# Patient Record
Sex: Female | Born: 1960 | Race: White | Hispanic: No | Smoking: Current every day smoker
Health system: Southern US, Community
[De-identification: ages and names within clinical notes are randomized; demographics above are authoritative.]

## PROBLEM LIST (undated history)

## (undated) DIAGNOSIS — E079 Disorder of thyroid, unspecified: Secondary | ICD-10-CM

## (undated) DIAGNOSIS — F329 Major depressive disorder, single episode, unspecified: Secondary | ICD-10-CM

## (undated) DIAGNOSIS — F909 Attention-deficit hyperactivity disorder, unspecified type: Secondary | ICD-10-CM

## (undated) DIAGNOSIS — N289 Disorder of kidney and ureter, unspecified: Secondary | ICD-10-CM

## (undated) DIAGNOSIS — F32A Depression, unspecified: Secondary | ICD-10-CM

## (undated) HISTORY — PX: GASTROPLASTY: SHX192

## (undated) HISTORY — PX: TUBAL LIGATION: SHX77

## (undated) HISTORY — PX: CARPAL TUNNEL RELEASE: SHX101

## (undated) HISTORY — PX: KNEE SURGERY: SHX244

---

## 2013-01-13 ENCOUNTER — Emergency Department: Payer: Self-pay | Admitting: Emergency Medicine

## 2013-10-08 ENCOUNTER — Encounter (HOSPITAL_COMMUNITY): Payer: Self-pay | Admitting: Emergency Medicine

## 2013-10-08 ENCOUNTER — Emergency Department (HOSPITAL_COMMUNITY)
Admission: EM | Admit: 2013-10-08 | Discharge: 2013-10-08 | Disposition: A | Discharge status: Home / Self Care | Payer: Self-pay | Attending: Emergency Medicine | Admitting: Emergency Medicine

## 2013-10-08 DIAGNOSIS — F17200 Nicotine dependence, unspecified, uncomplicated: Secondary | ICD-10-CM | POA: Insufficient documentation

## 2013-10-08 DIAGNOSIS — Z760 Encounter for issue of repeat prescription: Secondary | ICD-10-CM | POA: Insufficient documentation

## 2013-10-08 DIAGNOSIS — Z8639 Personal history of other endocrine, nutritional and metabolic disease: Secondary | ICD-10-CM | POA: Insufficient documentation

## 2013-10-08 DIAGNOSIS — Z862 Personal history of diseases of the blood and blood-forming organs and certain disorders involving the immune mechanism: Secondary | ICD-10-CM | POA: Insufficient documentation

## 2013-10-08 HISTORY — DX: Disorder of thyroid, unspecified: E07.9

## 2013-10-08 NOTE — ED Notes (Signed)
Pt reports to ED due to being "out of synthroid" for two days and needs a medication refill. Pt has a 4/10 HA but has  no other complaints

## 2013-10-08 NOTE — Discharge Instructions (Signed)
Medication Refill, Emergency Department Keep your scheduled appointment at the clinic on August 11. Ask them to recheck her blood pressure. Face was mildly elevated at 141/86 We have refilled your medication today as a courtesy to you. It is best for your medical care, however, to take care of getting refills done through your primary caregiver's office. They have your records and can do a better job of follow-up than we can in the emergency department. On maintenance medications, we often only prescribe enough medications to get you by until you are able to see your regular caregiver. This is a more expensive way to refill medications. In the future, please plan for refills so that you will not have to use the emergency department for this. Thank you for your help. Your help allows us to better take care of the daily emergencies that enter our department. Document Released: 06/14/2003 Document Revised: 05/20/2011 Document Reviewed: 06/04/2013 [facility] Patient Information 2015 [place], [place]. This information is not intended to replace advice given to you by your health care provider. Make sure you discuss any questions you have with your health care provider.

## 2013-10-08 NOTE — ED Provider Notes (Signed)
CSN: [id number]     Arrival date & time 10/08/13  1000 History   This chart was scribed for Doug SouSam Cristen Murcia, MD by Milly JakobJohn Lee Graves, ED Scribe. The patient was seen in room APA18/APA18. Patient's care was started at 10:24 AM.    Chief Complaint  Patient presents with  . Medication Refill   The history is provided by the patient. No language interpreter was used.   HPI Comments: Elizabeth Williams is a 53 y.o. female with a history of thyroid disease who presents to the Emergency Department because she needs a prescription refill for synthroid. She reports that she feels rundown and "dropsy" and that this is similar to her thyroid symptoms in the past. She denies having any problems taking synthroid in the past, and states that her dosage has not changed for 2 years. She reports that she is new in town and that she lost her job in June. She states that she is scheduled for an appointment at the free clinic on August 11th. She denies taking any other medications or allergy to any medications. She denies any other health problems. She drinks 1 alcoholic beverage per week. She is not a smoker and she does not use illegal drugs.   Past Medical History  Diagnosis Date  . Thyroid disease    History reviewed. No pertinent past surgical history. No family history on file. History  Substance Use Topics  . Smoking status: Current Every Day Smoker -- 1.00 packs/day    Types: Cigarettes  . Smokeless tobacco: Not on file  . Alcohol Use: 0.6 oz/week    1 Glasses of wine per week   OB History   Grav Para Term Preterm Abortions TAB SAB Ect Mult Living                 Review of Systems  Constitutional: Negative.   HENT: Negative.   Respiratory: Negative.   Cardiovascular: Negative.   Gastrointestinal: Negative.   Musculoskeletal: Negative.   Skin: Negative.   Neurological: Negative.   Psychiatric/Behavioral: Negative.   All other systems reviewed and are negative.  Allergies  Codeine  Home  Medications   Prior to Admission medications   Not on File   Triage Vitals: BP 153/100  Pulse 73  Temp(Src) 97.8 F (36.6 C) (Oral)  Resp 16  Ht 5\' 9"  (1.753 m)  Wt 180 lb (81.647 kg)  BMI 26.57 kg/m2  SpO2 99%  LMP 08/08/2013 Physical Exam  Nursing note and vitals reviewed. Constitutional: She appears well-developed and well-nourished.  HENT:  Head: Normocephalic and atraumatic.  Eyes: Conjunctivae are normal. Pupils are equal, round, and reactive to light.  Neck: Neck supple. No tracheal deviation present. No thyromegaly present.  Cardiovascular: Normal rate and regular rhythm.   No murmur heard. Pulmonary/Chest: Effort normal and breath sounds normal.  Abdominal: Soft. Bowel sounds are normal. She exhibits no distension. There is no tenderness.  Musculoskeletal: Normal range of motion. She exhibits no edema and no tenderness.  Neurological: She is alert. Coordination normal.  Skin: Skin is warm and dry. No rash noted.  Psychiatric: She has a normal mood and affect.    ED Course  Procedures (including critical care time) DIAGNOSTIC STUDIES: Oxygen Saturation is 99% on room air, normal by my interpretation.    COORDINATION OF CARE: 10:28 AM-Discussed treatment plan which includes prescription refill with pt at bedside and pt agreed to plan.   Labs Review Labs Reviewed - No data to display  Imaging Review No  results found.   EKG Interpretation None      MDM  Final diagnoses:  None   Plan prescription Synthroid, 75 mcg one half tablet daily 15 tablets no refills. Blood pressure recheck Patient has scheduled appointment with clinic for August 11 Diagnosis #1 medication refill #2 elevated blood pressure      Doug Sou, MD 10/08/13 1047

## 2013-10-08 NOTE — ED Notes (Signed)
Pt states she is here for refill of synthroid rx. Pt is new to area and currently without insurance.

## 2014-04-13 ENCOUNTER — Emergency Department (HOSPITAL_COMMUNITY)
Admission: EM | Admit: 2014-04-13 | Discharge: 2014-04-13 | Disposition: A | Discharge status: Home / Self Care | Payer: Self-pay | Attending: Emergency Medicine | Admitting: Emergency Medicine

## 2014-04-13 ENCOUNTER — Emergency Department (HOSPITAL_COMMUNITY): Payer: Self-pay

## 2014-04-13 ENCOUNTER — Encounter (HOSPITAL_COMMUNITY): Payer: Self-pay | Admitting: Emergency Medicine

## 2014-04-13 DIAGNOSIS — F329 Major depressive disorder, single episode, unspecified: Secondary | ICD-10-CM | POA: Insufficient documentation

## 2014-04-13 DIAGNOSIS — Y9301 Activity, walking, marching and hiking: Secondary | ICD-10-CM | POA: Insufficient documentation

## 2014-04-13 DIAGNOSIS — S8392XA Sprain of unspecified site of left knee, initial encounter: Secondary | ICD-10-CM | POA: Insufficient documentation

## 2014-04-13 DIAGNOSIS — Y998 Other external cause status: Secondary | ICD-10-CM | POA: Insufficient documentation

## 2014-04-13 DIAGNOSIS — Z79899 Other long term (current) drug therapy: Secondary | ICD-10-CM | POA: Insufficient documentation

## 2014-04-13 DIAGNOSIS — W1849XA Other slipping, tripping and stumbling without falling, initial encounter: Secondary | ICD-10-CM | POA: Insufficient documentation

## 2014-04-13 DIAGNOSIS — Z720 Tobacco use: Secondary | ICD-10-CM | POA: Insufficient documentation

## 2014-04-13 DIAGNOSIS — Y9289 Other specified places as the place of occurrence of the external cause: Secondary | ICD-10-CM | POA: Insufficient documentation

## 2014-04-13 DIAGNOSIS — S86902A Unspecified injury of unspecified muscle(s) and tendon(s) at lower leg level, left leg, initial encounter: Secondary | ICD-10-CM | POA: Insufficient documentation

## 2014-04-13 DIAGNOSIS — Z8639 Personal history of other endocrine, nutritional and metabolic disease: Secondary | ICD-10-CM | POA: Insufficient documentation

## 2014-04-13 HISTORY — DX: Depression, unspecified: F32.A

## 2014-04-13 HISTORY — DX: Major depressive disorder, single episode, unspecified: F32.9

## 2014-04-13 MED ORDER — HYDROCODONE-ACETAMINOPHEN 5-325 MG PO TABS: 1.0000 | ORAL_TABLET | ORAL | Status: DC | PRN

## 2014-04-13 NOTE — ED Notes (Signed)
Patient states she felt a "tweek" in her left knee last week when walking in the snow. Complaining of left knee pain worsening over the week. States she has a history of knee surgery on bilateral knees.

## 2014-04-13 NOTE — Discharge Instructions (Signed)
Joint Sprain A sprain is a tear or stretch in the ligaments that hold a joint together. Severe sprains may need as long as 3-6 weeks of immobilization and/or exercises to heal completely. Sprained joints should be rested and protected. If not, they can become unstable and prone to re-injury. Proper treatment can reduce your pain, shorten the period of disability, and reduce the risk of repeated injuries. TREATMENT   Rest and elevate the injured joint to reduce pain and swelling.  Apply ice packs to the injury for 20-30 minutes every 2-3 hours for the next 2-3 days.  Keep the injury wrapped in a compression bandage or splint as long as the joint is painful or as instructed by your caregiver.  Do not use the injured joint until it is completely healed to prevent re-injury and chronic instability. Follow the instructions of your caregiver.  Long-term sprain management may require exercises and/or treatment by a physical therapist. Taping or special braces may help stabilize the joint until it is completely better. SEEK MEDICAL CARE IF:   You develop increased pain or swelling of the joint.  You develop increasing redness and warmth of the joint.  You develop a fever.  It becomes stiff.  Your hand or foot gets cold or numb. Document Released: 04/04/2004 Document Revised: 05/20/2011 Document Reviewed: 03/14/2008 [facility]Exit[facility] Patient Information 2015 [place], [place]. This information is not intended to replace advice given to you by your health care provider. Make sure you discuss any questions you have with your health care provider.   You may take the hydrocodone prescribed for pain relief.  This will make you drowsy - do not drive within 4 hours of taking this medication. Use the crutches to help rest the joint and wear the knee immobilizer also for rest and protection of the knee.

## 2014-04-13 NOTE — Care Management Note (Signed)
ED/CM noted patient did not have health insurance and/or PCP listed in the computer.  Patient was given the [place] resource handout with information on the clinics, food pantries, and the handout for new health insurance sign-up. Pt was also given a Rx discount card. Patient expressed appreciation for information received. 

## 2014-04-13 NOTE — ED Notes (Signed)
Pt verbalized understanding of no driving and to use caution within 4 hours of taking pain meds due to meds cause drowsiness 

## 2014-04-15 NOTE — ED Provider Notes (Signed)
CSN: [id number]     Arrival date & time 04/13/14  1121 History   First MD Initiated Contact with Patient 04/13/14 1141     Chief Complaint  Patient presents with  . Knee Pain     (Consider location/radiation/quality/duration/timing/severity/associated sxs/prior Treatment) The history is provided by the patient.   Elizabeth BrazilBeryl Bollen is a 54 y.o. female presenting with left knee pain when she slipped (without falling) while walking on the snow and ice last week.  Since she has had persistent pain mostly of the anterior but also posterior knee space along with a fullness and pressure sensation, intermittent swelling and sometimes sensation of instability.  She has a history prior surgery of this knee for a ligament injury. Pain is worsened with movement and improved with rest.  She has used ice and heat without no significant long term improvement.       Past Medical History  Diagnosis Date  . Thyroid disease   . Depression    Past Surgical History  Procedure Laterality Date  . Gastroplasty    . Knee surgery    . Carpal tunnel release     History reviewed. No pertinent family history. History  Substance Use Topics  . Smoking status: Current Every Day Smoker -- 1.00 packs/day    Types: Cigarettes  . Smokeless tobacco: Not on file  . Alcohol Use: 0.6 oz/week    1 Glasses of wine per week     Comment: "once a week"   OB History    No data available     Review of Systems  Constitutional: Negative for fever.  Musculoskeletal: Positive for joint swelling and arthralgias. Negative for myalgias.  Neurological: Negative for weakness and numbness.      Allergies  Codeine and Nsaids  Home Medications   Prior to Admission medications   Medication Sig Start Date End Date Taking? Authorizing Provider  buPROPion (WELLBUTRIN XL) 300 MG 24 hr tablet Take 300 mg by mouth daily.   Yes Historical Provider, MD  HYDROcodone-acetaminophen (NORCO/VICODIN) 5-325 MG per tablet Take 1 tablet  by mouth every 4 (four) hours as needed. 04/13/14   Burgess AmorJulie Naasir Carreira, PA-C   BP 139/86 mmHg  Pulse 75  Temp(Src) 98.6 F (37 C) (Oral)  Resp 16  Ht 5\' 9"  (1.753 m)  Wt 200 lb (90.719 kg)  BMI 29.52 kg/m2  SpO2 99% Physical Exam  Constitutional: She appears well-developed and well-nourished.  HENT:  Head: Atraumatic.  Neck: Normal range of motion.  Cardiovascular:  Pulses equal bilaterally  Musculoskeletal: She exhibits tenderness.       Left knee: She exhibits swelling. She exhibits no effusion, no deformity, no erythema, normal alignment, no LCL laxity, no bony tenderness, normal meniscus and no MCL laxity. Tenderness found. Patellar tendon tenderness noted.  Neurological: She is alert. She has normal strength. She displays normal reflexes. No sensory deficit.  Skin: Skin is warm and dry.  Psychiatric: She has a normal mood and affect.    ED Course  Procedures (including critical care time) Labs Review Labs Reviewed - No data to display  Imaging Review Dg Knee Complete 4 Views Left  04/13/2014   CLINICAL DATA:  Medial knee pain following injury 1 week ago. Initial encounter.  EXAM: LEFT KNEE - COMPLETE 4+ VIEW  COMPARISON:  01/13/2013 radiographs.  FINDINGS: The bones appear mildly demineralized. There is no evidence of acute fracture, dislocation or joint effusion. Mild tricompartmental degenerative changes are present. The metallic staple anteriorly in the proximal tibia  appears unchanged. There is possible mild soft tissue swelling anterior to the patellar tendon.  IMPRESSION: No acute osseous findings. Mild degenerative changes noted. Possible mild prepatellar soft tissue swelling.   Electronically Signed   By: Roxy Horseman M.D.   On: 04/13/2014 11:54     EKG Interpretation None      MDM   Final diagnoses:  Knee sprain and strain, left, initial encounter    Patients labs and/or radiological studies were viewed and considered during the medical decision making and disposition  process. Pt was prescribed hydrocodone, immobilizer and crutches provided, continued heat.  Advised f/u with ortho for further management if sx persist, referral given.    Burgess Amor, PA-C 04/15/14 1222  Geoffery Lyons, MD 04/16/14 [phone]

## 2014-05-22 ENCOUNTER — Emergency Department (HOSPITAL_COMMUNITY)
Admission: EM | Admit: 2014-05-22 | Discharge: 2014-05-22 | Disposition: A | Discharge status: Home / Self Care | Payer: Self-pay | Attending: Emergency Medicine | Admitting: Emergency Medicine

## 2014-05-22 ENCOUNTER — Emergency Department (HOSPITAL_COMMUNITY): Payer: Self-pay

## 2014-05-22 ENCOUNTER — Encounter (HOSPITAL_COMMUNITY): Payer: Self-pay | Admitting: Emergency Medicine

## 2014-05-22 DIAGNOSIS — Z720 Tobacco use: Secondary | ICD-10-CM | POA: Insufficient documentation

## 2014-05-22 DIAGNOSIS — F329 Major depressive disorder, single episode, unspecified: Secondary | ICD-10-CM | POA: Insufficient documentation

## 2014-05-22 DIAGNOSIS — M25562 Pain in left knee: Secondary | ICD-10-CM | POA: Insufficient documentation

## 2014-05-22 DIAGNOSIS — Z9889 Other specified postprocedural states: Secondary | ICD-10-CM | POA: Insufficient documentation

## 2014-05-22 DIAGNOSIS — Z8639 Personal history of other endocrine, nutritional and metabolic disease: Secondary | ICD-10-CM | POA: Insufficient documentation

## 2014-05-22 DIAGNOSIS — Z79899 Other long term (current) drug therapy: Secondary | ICD-10-CM | POA: Insufficient documentation

## 2014-05-22 MED ORDER — ONDANSETRON 8 MG PO TBDP
8.0000 mg | ORAL_TABLET | Freq: Once | ORAL | Status: AC
  Administered 2014-05-22: 8 mg via ORAL
  Filled 2014-05-22: qty 1

## 2014-05-22 MED ORDER — ONDANSETRON HCL 4 MG PO TABS: 4.0000 mg | ORAL_TABLET | Freq: Four times a day (QID) | ORAL | Status: DC

## 2014-05-22 MED ORDER — OXYCODONE-ACETAMINOPHEN 5-325 MG PO TABS: 1.0000 | ORAL_TABLET | ORAL | Status: DC | PRN

## 2014-05-22 MED ORDER — OXYCODONE-ACETAMINOPHEN 5-325 MG PO TABS
1.0000 | ORAL_TABLET | Freq: Once | ORAL | Status: AC
  Administered 2014-05-22: 1 via ORAL
  Filled 2014-05-22: qty 1

## 2014-05-22 NOTE — Discharge Instructions (Signed)

## 2014-05-22 NOTE — ED Notes (Signed)
Pt injured her knee in Jan. After slipping in the snow. Had been better but pt woke up Tues with pain and edema. Pt does not recall specific injury.

## 2014-05-22 NOTE — ED Provider Notes (Signed)
CSN: [id number]     Arrival date & time 05/22/14  1227 History  This chart was scribed for Pauline Aus, PA-C with Donnetta Hutching, MD by Tonye Royalty, ED Scribe. This patient was seen in room APFT24/APFT24 and the patient's care was started at 1:20 PM.    Chief Complaint  Patient presents with  . Knee Pain   The history is provided by the patient. No language interpreter was used.    HPI Comments: Elizabeth Williams is a 54 y.o. female who presents to the Emergency Department complaining of left knee pain. She states she slipped in the snow in January and strained it. She was evaluated here a few days after, records indicate she was seen on 04/13/2014; workup included x-ray which revealed mild degenerative changes and mild pre-patellar soft tissue swelling, but no acute osseous findings. She states pain improved some but never resolved, then pain and swelling worsened 5 days ago. She states pain is worse now than it was in January.  She denies any recent exertion. Redness, excessive warmth, or injury. She states she is unable to put weight on it. She notes her knew cap was "popping" this morning. She states she has used ice and Ibuprofen without remission. She reports surgery to both knees when she was a teenager.  Past Medical History  Diagnosis Date  . Thyroid disease   . Depression    Past Surgical History  Procedure Laterality Date  . Gastroplasty    . Knee surgery    . Carpal tunnel release    . Tubal ligation     Family History  Problem Relation Age of Onset  . Adopted: Yes  . Diabetes Mother   . Diabetes Other    History  Substance Use Topics  . Smoking status: Current Every Day Smoker -- 1.00 packs/day    Types: Cigarettes  . Smokeless tobacco: Never Used  . Alcohol Use: 0.6 oz/week    1 Glasses of wine per week     Comment: "once a week"   OB History    No data available     Review of Systems  Constitutional: Negative for fever and chills.  Genitourinary: Negative for  dysuria and difficulty urinating.  Musculoskeletal: Positive for joint swelling and arthralgias.       Left knee pain  Skin: Negative for color change and wound.  Neurological: Negative for numbness.  All other systems reviewed and are negative.     Allergies  Codeine and Nsaids  Home Medications   Prior to Admission medications   Medication Sig Start Date End Date Taking? Authorizing Provider  buPROPion (WELLBUTRIN XL) 300 MG 24 hr tablet Take 300 mg by mouth daily.    Historical Provider, MD  HYDROcodone-acetaminophen (NORCO/VICODIN) 5-325 MG per tablet Take 1 tablet by mouth every 4 (four) hours as needed. 04/13/14   Burgess Amor, PA-C   BP 149/84 mmHg  Pulse 98  Temp(Src) 98.4 F (36.9 C) (Oral)  Resp 14  Ht  (1.753 m)  Wt 190 lb (86.183 kg)  BMI 28.05 kg/m2  SpO2 97% Physical Exam  Constitutional: She is oriented to person, place, and time. She appears well-developed and well-nourished.  HENT:  Head: Normocephalic and atraumatic.  Eyes: Conjunctivae are normal.  Neck: Normal range of motion. Neck supple.  Cardiovascular: Normal rate, regular rhythm, normal heart sounds and intact distal pulses.   No murmur heard. Pulmonary/Chest: Effort normal and breath sounds normal. No respiratory distress.  Musculoskeletal: Normal range of motion.  She exhibits tenderness.  Mild soft tissue swelling at the pre-patellar area of the left knee No effusion No crepitus or erythema  Neurological: She is alert and oriented to person, place, and time. She exhibits normal muscle tone. Coordination normal.  Skin: Skin is warm and dry.  Psychiatric: She has a normal mood and affect.  Nursing note and vitals reviewed.   ED Course  Procedures (including critical care time)  DIAGNOSTIC STUDIES: Oxygen Saturation is 97% on room air, normal by my interpretation.    COORDINATION OF CARE: 1:27 PM Discussed treatment plan with patient at beside, including ice pack, pain medication, x-ray,   and follow up with orthopedist. She states she has her own crutches and knee immobilizer. The patient agrees with the plan and has no further questions at this time.  Labs Review Labs Reviewed - No data to display  Imaging Review Dg Knee Complete 4 Views Left  05/22/2014   CLINICAL DATA:  54 year old female with history of trauma from a fall in January 2016, injury to the left knee at that time complaining of recurrent pain for the past 5 days.  EXAM: LEFT KNEE - COMPLETE 4+ VIEW  COMPARISON:  04/13/2014.  FINDINGS: Four views of the left knee demonstrate no acute displaced fracture, subluxation or dislocation. Mild joint space narrowing, subchondral sclerosis and osteophyte formation is noted in a tricompartmental distribution, most severe in the patellofemoral compartment. Fixation staple anteriorly in the proximal tibia is unchanged.  IMPRESSION: 1. No acute radiographic abnormality of the left knee. 2. 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   Electronically Signed   By: Trudie Reedaniel  Entrikin M.D.   On: 05/22/2014 14:15      EKG Interpretation None      MDM   Final diagnoses:  Left knee pain    Chronic findings of the knee.  No concerning sx's for septic joint. Pt agrees to close ortho f/u. Appears stable for d/c  I personally performed the services described in this documentation, which was scribed in my presence. The recorded information has been reviewed and is accurate.   Severiano Gilbertammi Ashari Llewellyn, PA-C 05/24/14 2115  Donnetta HutchingBrian Cook, MD 05/25/14 1355

## 2014-05-30 ENCOUNTER — Ambulatory Visit: Payer: Self-pay | Admitting: Orthopedic Surgery

## 2014-06-15 ENCOUNTER — Encounter (HOSPITAL_COMMUNITY): Payer: Self-pay | Admitting: Emergency Medicine

## 2014-06-15 ENCOUNTER — Emergency Department (HOSPITAL_COMMUNITY)
Admission: EM | Admit: 2014-06-15 | Discharge: 2014-06-15 | Disposition: A | Discharge status: Home / Self Care | Payer: Self-pay | Attending: Emergency Medicine | Admitting: Emergency Medicine

## 2014-06-15 DIAGNOSIS — N390 Urinary tract infection, site not specified: Secondary | ICD-10-CM

## 2014-06-15 DIAGNOSIS — Z8639 Personal history of other endocrine, nutritional and metabolic disease: Secondary | ICD-10-CM | POA: Insufficient documentation

## 2014-06-15 DIAGNOSIS — F329 Major depressive disorder, single episode, unspecified: Secondary | ICD-10-CM | POA: Insufficient documentation

## 2014-06-15 DIAGNOSIS — I951 Orthostatic hypotension: Secondary | ICD-10-CM

## 2014-06-15 DIAGNOSIS — N289 Disorder of kidney and ureter, unspecified: Secondary | ICD-10-CM

## 2014-06-15 DIAGNOSIS — Z79899 Other long term (current) drug therapy: Secondary | ICD-10-CM | POA: Insufficient documentation

## 2014-06-15 DIAGNOSIS — Z720 Tobacco use: Secondary | ICD-10-CM | POA: Insufficient documentation

## 2014-06-15 HISTORY — DX: Disorder of kidney and ureter, unspecified: N28.9

## 2014-06-15 LAB — CBC WITH DIFFERENTIAL/PLATELET
Basophils Absolute: 0 10*3/uL (ref 0.0–0.1)
Basophils Relative: 0 % (ref 0–1)
Eosinophils Absolute: 0.1 10*3/uL (ref 0.0–0.7)
Eosinophils Relative: 1 % (ref 0–5)
HEMATOCRIT: 41.4 % (ref 36.0–46.0)
HEMOGLOBIN: 13.8 g/dL (ref 12.0–15.0)
LYMPHS ABS: 2.1 10*3/uL (ref 0.7–4.0)
LYMPHS PCT: 16 % (ref 12–46)
MCH: 29.6 pg (ref 26.0–34.0)
MCHC: 33.3 g/dL (ref 30.0–36.0)
MCV: 88.7 fL (ref 78.0–100.0)
MONO ABS: 1.2 10*3/uL — AB (ref 0.1–1.0)
Monocytes Relative: 9 % (ref 3–12)
Neutro Abs: 9.6 10*3/uL — ABNORMAL HIGH (ref 1.7–7.7)
Neutrophils Relative %: 74 % (ref 43–77)
Platelets: 195 10*3/uL (ref 150–400)
RBC: 4.67 MIL/uL (ref 3.87–5.11)
RDW: 12.7 % (ref 11.5–15.5)
WBC: 13 10*3/uL — ABNORMAL HIGH (ref 4.0–10.5)

## 2014-06-15 LAB — BASIC METABOLIC PANEL
Anion gap: 7 (ref 5–15)
BUN: 16 mg/dL (ref 6–23)
CO2: 24 mmol/L (ref 19–32)
Calcium: 8.9 mg/dL (ref 8.4–10.5)
Chloride: 104 mmol/L (ref 96–112)
Creatinine, Ser: 1.63 mg/dL — ABNORMAL HIGH (ref 0.50–1.10)
GFR calc Af Amer: 40 mL/min — ABNORMAL LOW (ref >90)
GFR calc non Af Amer: 35 mL/min — ABNORMAL LOW (ref >90)
GLUCOSE: 126 mg/dL — AB (ref 70–99)
POTASSIUM: 3.7 mmol/L (ref 3.5–5.1)
SODIUM: 135 mmol/L (ref 135–145)

## 2014-06-15 LAB — URINALYSIS, ROUTINE W REFLEX MICROSCOPIC
GLUCOSE, UA: NEGATIVE mg/dL
Nitrite: POSITIVE — AB
PH: 5.5 (ref 5.0–8.0)
PROTEIN: 100 mg/dL — AB
Urobilinogen, UA: 1 mg/dL (ref 0.0–1.0)

## 2014-06-15 LAB — URINE MICROSCOPIC-ADD ON

## 2014-06-15 LAB — LACTIC ACID, PLASMA: Lactic Acid, Venous: 0.8 mmol/L (ref 0.5–2.0)

## 2014-06-15 MED ORDER — CEPHALEXIN 500 MG PO CAPS: 500.0000 mg | ORAL_CAPSULE | Freq: Four times a day (QID) | ORAL | Status: DC

## 2014-06-15 MED ORDER — DEXTROSE 5 % IV SOLN
1.0000 g | Freq: Once | INTRAVENOUS | Status: AC
  Administered 2014-06-15: 1 g via INTRAVENOUS
  Filled 2014-06-15: qty 10

## 2014-06-15 MED ORDER — SODIUM CHLORIDE 0.9 % IV BOLUS (SEPSIS)
1000.0000 mL | Freq: Once | INTRAVENOUS | Status: AC
  Administered 2014-06-15: 1000 mL via INTRAVENOUS

## 2014-06-15 NOTE — ED Provider Notes (Signed)
CSN: [id number]     Arrival date & time 06/15/14  1140 History   First MD Initiated Contact with Patient 06/15/14 1203     Chief Complaint  Patient presents with  . Weakness      HPI Pt was seen at 1225. Per pt, c/o gradual onset and persistence of constant dysuria for the past 1 month. Has been associated with generalized weakness/fatigue and lightheadedness since yesterday. Denies N/V/D, no abd pain, no back pain, no CP/SOB, no fevers, no rash, no focal motor weakness, no tingling/numbness in extremities.     Past Medical History  Diagnosis Date  . Thyroid disease   . Depression   . Renal insufficiency    Past Surgical History  Procedure Laterality Date  . Gastroplasty    . Knee surgery    . Carpal tunnel release    . Tubal ligation     Family History  Problem Relation Age of Onset  . Adopted: Yes  . Diabetes Mother   . Diabetes Other    History  Substance Use Topics  . Smoking status: Current Every Day Smoker -- 1.00 packs/day    Types: Cigarettes  . Smokeless tobacco: Never Used  . Alcohol Use: 0.6 oz/week    1 Glasses of wine per week     Comment: "once a week"    Review of Systems ROS: Statement: All systems negative except as marked or noted in the HPI; Constitutional: Negative for fever and chills. +generalized weakness/fatigue, lightheadedness. ; ; Eyes: Negative for eye pain, redness and discharge. ; ; ENMT: Negative for ear pain, hoarseness, nasal congestion, sinus pressure and sore throat. ; ; Cardiovascular: Negative for chest pain, palpitations, diaphoresis, dyspnea and peripheral edema. ; ; Respiratory: Negative for cough, wheezing and stridor. ; ; Gastrointestinal: Negative for nausea, vomiting, diarrhea, abdominal pain, blood in stool, hematemesis, jaundice and rectal bleeding. . ; ; Genitourinary: +dysuria. Negative for flank pain and hematuria. ; ; Musculoskeletal: Negative for back pain and neck pain. Negative for swelling and trauma.; ; Skin: Negative  for pruritus, rash, abrasions, blisters, bruising and skin lesion.; ; Neuro: Negative for headache and neck stiffness. Negative for altered level of consciousness , altered mental status, extremity weakness, paresthesias, involuntary movement, seizure and syncope.      Allergies  Codeine and Nsaids  Home Medications   Prior to Admission medications   Medication Sig Start Date End Date Taking? Authorizing Provider  buPROPion (WELLBUTRIN XL) 300 MG 24 hr tablet Take 300 mg by mouth daily.   Yes Historical Provider, MD  ondansetron (ZOFRAN) 4 MG tablet Take 1 tablet (4 mg total) by mouth every 6 (six) hours. Patient not taking: Reported on 06/15/2014 05/22/14   Tammi Triplett, PA-C  oxyCODONE-acetaminophen (PERCOCET/ROXICET) 5-325 MG per tablet Take 1 tablet by mouth every 4 (four) hours as needed. Patient not taking: Reported on 06/15/2014 05/22/14   Tammi Triplett, PA-C   BP 117/75 mmHg  Pulse 98  Temp(Src) 98 F (36.7 C) (Oral)  Resp 18  Ht  (1.753 m)  Wt 190 lb (86.183 kg)  BMI 28.05 kg/m2  SpO2 98%    Filed Vitals:   06/15/14 1330 06/15/14 1345 06/15/14 1400 06/15/14 1435  BP: 116/79  113/76 123/82  Pulse: 69 65 80 77  Temp:    98.3 F (36.8 C)  TempSrc:    Oral  Resp:    16  Height:      Weight:      SpO2: 99% 98% 100% 100%  Physical Exam  1230: Physical examination:  Nursing notes reviewed; Vital signs and O2 SAT reviewed;  Constitutional: Well developed, Well nourished, Well hydrated, In no acute distress; Head:  Normocephalic, atraumatic; Eyes: EOMI, PERRL, No scleral icterus; ENMT: TM's clear bilat. +edemetous nasal turbinates bilat with clear rhinorrhea. Mouth and pharynx normal, Mucous membranes moist; Neck: Supple, Full range of motion, No lymphadenopathy; Cardiovascular: Regular rate and rhythm, No murmur, rub, or gallop; Respiratory: Breath sounds clear & equal bilaterally, No rales, rhonchi, wheezes.  Speaking full sentences with ease, Normal respiratory  effort/excursion; Chest: Nontender, Movement normal; Abdomen: Soft, Nontender, Nondistended, Normal bowel sounds; Genitourinary: No CVA tenderness; Extremities: Pulses normal, No tenderness, No edema, No calf edema or asymmetry.; Neuro: AA&Ox3, Major CN grossly intact. Speech clear.  No facial droop.  No nystagmus. Grips equal. Strength 5/5 equal bilat UE's and LE's.  DTR 2/4 equal bilat UE's and LE's.  No gross sensory deficits.  Normal cerebellar testing bilat UE's (finger-nose) and LE's (heel-shin)..; Skin: Color normal, Warm, Dry.   ED Course  Procedures      EKG Interpretation None      MDM  MDM Reviewed: previous chart, nursing note and vitals Interpretation: labs     Results for orders placed or performed during the hospital encounter of 06/15/14  Urinalysis, Routine w reflex microscopic  Result Value Ref Range   Color, Urine YELLOW YELLOW   APPearance HAZY (A) CLEAR   Specific Gravity, Urine >1.030 (H) 1.005 - 1.030   pH 5.5 5.0 - 8.0   Glucose, UA NEGATIVE NEGATIVE mg/dL   Hgb urine dipstick LARGE (A) NEGATIVE   Bilirubin Urine MODERATE (A) NEGATIVE   Ketones, ur TRACE (A) NEGATIVE mg/dL   Protein, ur 161 (A) NEGATIVE mg/dL   Urobilinogen, UA 1.0 0.0 - 1.0 mg/dL   Nitrite POSITIVE (A) NEGATIVE   Leukocytes, UA MODERATE (A) NEGATIVE  Basic metabolic panel  Result Value Ref Range   Sodium 135 135 - 145 mmol/L   Potassium 3.7 3.5 - 5.1 mmol/L   Chloride 104 96 - 112 mmol/L   CO2 24 19 - 32 mmol/L   Glucose, Bld 126 (H) 70 - 99 mg/dL   BUN 16 6 - 23 mg/dL   Creatinine, Ser 0.96 (H) 0.50 - 1.10 mg/dL   Calcium 8.9 8.4 - 04.5 mg/dL   GFR calc non Af Amer 35 (L) >90 mL/min   GFR calc Af Amer 40 (L) >90 mL/min   Anion gap 7 5 - 15  CBC with Differential  Result Value Ref Range   WBC 13.0 (H) 4.0 - 10.5 K/uL   RBC 4.67 3.87 - 5.11 MIL/uL   Hemoglobin 13.8 12.0 - 15.0 g/dL   HCT 40.9 81.1 - 91.4 %   MCV 88.7 78.0 - 100.0 fL   MCH 29.6 26.0 - 34.0 pg   MCHC 33.3  30.0 - 36.0 g/dL   RDW 78.2 95.6 - 21.3 %   Platelets 195 150 - 400 K/uL   Neutrophils Relative % 74 43 - 77 %   Neutro Abs 9.6 (H) 1.7 - 7.7 K/uL   Lymphocytes Relative 16 12 - 46 %   Lymphs Abs 2.1 0.7 - 4.0 K/uL   Monocytes Relative 9 3 - 12 %   Monocytes Absolute 1.2 (H) 0.1 - 1.0 K/uL   Eosinophils Relative 1 0 - 5 %   Eosinophils Absolute 0.1 0.0 - 0.7 K/uL   Basophils Relative 0 0 - 1 %   Basophils Absolute 0.0 0.0 -  0.1 K/uL  Lactic acid, plasma  Result Value Ref Range   Lactic Acid, Venous 0.8 0.5 - 2.0 mmol/L  Urine microscopic-add on  Result Value Ref Range   Squamous Epithelial / LPF FEW (A) RARE   WBC, UA TOO NUMEROUS TO COUNT <3 WBC/hpf   RBC / HPF TOO NUMEROUS TO COUNT <3 RBC/hpf   Bacteria, UA MANY (A) RARE   12:05 Orthostatic Vital Signs MH  Orthostatic Lying  - BP- Lying: 116/86 mmHg ; Pulse- Lying: 86  Orthostatic Sitting - BP- Sitting: 116/86 mmHg ; Pulse- Sitting: 100  Orthostatic Standing at 0 minutes - BP- Standing at 0 minutes: 104/92 mmHg ; Pulse- Standing at 0 minutes: 106      1420:  Pt has tol PO well while in the ED without N/V.  No stooling while in the ED.  Abd remains benign. BUN/Cr elevated; no old to compare, but pt endorses hx of same. Pt orthostatic on VS; IVF bolus given.  After IVF: pt's VSS, and she has ambulated with steady upright gait, easy resps, NAD. +UTI, UC pending; will dose IV rocephin, rx keflex. Pt states she feels better and wants to go home now. Dx and testing d/w pt.  Questions answered.  Verb understanding, agreeable to d/c home with outpt f/u.       Samuel JesterKathleen Arionna Hoggard, DO 06/18/14 1615

## 2014-06-15 NOTE — Discharge Instructions (Signed)
°Emergency Department Resource Guide °1) Find a Doctor and Pay Out of Pocket °Although you won't have to find out who is covered by your insurance plan, it is a good idea to ask around and get recommendations. You will then need to call the office and see if the doctor you have chosen will accept you as a new patient and what types of options they offer for patients who are self-pay. Some doctors offer discounts or will set up payment plans for their patients who do not have insurance, but you will need to ask so you aren't surprised when you get to your appointment. ° °2) Contact Your Local Health Department °Not all health departments have doctors that can see patients for sick visits, but many do, so it is worth a call to see if yours does. If you don't know where your local health department is, you can check in your phone book. The CDC also has a tool to help you locate your state's health department, and many state websites also have listings of all of their local health departments. ° °3) Find a Walk-in Clinic °If your illness is not likely to be very severe or complicated, you may want to try a walk in clinic. These are popping up all over the country in pharmacies, drugstores, and shopping centers. They're usually staffed by nurse practitioners or physician assistants that have been trained to treat common illnesses and complaints. They're usually fairly quick and inexpensive. However, if you have serious medical issues or chronic medical problems, these are probably not your best option. ° °No Primary Care Doctor: °- Call [facility] at  832-8000 - they can help you locate a primary care doctor that  accepts your insurance, provides certain services, etc. °- Physician Referral Service- 1-800-533-3463 ° °Chronic Pain Problems: °Organization         Address  Phone   Notes  °[facility]  ([phone]) [phone] Patients need to be referred by their primary care doctor.  ° °Medication  Assistance: °Organization         Address  Phone   Notes  °[place] [facility] [address] °[place], [place] [address] (336) 641-8030 --Must be a resident of [place] °-- Must have NO insurance coverage whatsoever (no Medicaid/ Medicare, etc.) °-- The pt. MUST have a primary care doctor that directs their care regularly and follows them in the community °  °[facility]  (866) 331-1348   °[employer]  (888) 892-1162   ° °Agencies that provide inexpensive medical care: °Organization         Address  Phone   Notes  °[facility]  (336) 832-8035   °[facility]    (336) 832-7272   °[facility] [address] °[place], [place] [address] (336) 832-4777   °[facility] [address], °[place] (336) 271-4999   °[facility]    (336) 373-0678   °[facility]    (336) 272-1050   °[facility] ° [address], [place] Phone:  (336) 832-4444, Fax:  (336) 832-4440 Hours of Operation:  9 am - 6 pm, M-F.  Also accepts Medicaid/Medicare and self-pay.  °[facility] ° [address], [place] Phone: (336) [phone], Fax: (336) [phone]. Hours of Operation:  8:30 am - 5:30 pm, M-F.  Also accepts Medicaid and self-pay.  °[facility] [place] [address], [place] Phone: (336) 878-6027   °[facility] [address], [place], [place] (336)723-1848, Ext. 123 Mondays & Thursdays: 7-9 AM.  First 15 patients are seen on a first come, first serve basis. °  ° °Medicaid-accepting [place] Providers: ° °Organization         Address  Phone   Notes  °[facility] [address], [place] (336) 641-2100 Also accepts self-pay patients.  °[facility] [address], [place] ° (336) [phone]   °[facility] [address], [place]  (336) 288-8857   °[facility] [address] [place] [address], [place] (336) 299-7000   °Veita Bland [address], [place]  ° (336) 373-1557 Only accepts [place] Access Medicaid patients after they have their name applied to their card.  ° °Self-Pay (no insurance) in [place]: ° °Organization         Address  Phone   Notes  °Sickle Cell Patients, [facility] [address], [place] (336) 832-1970   °[facility] [address], [place] (336) 832-4400   °[facility] ° [address], [place] (336) 992-4800   °[facility]/Dr. Osei-Bonsu ° [address] [place] [address], [place] or [address], [place] (336) [phone] Phone number for both [place] and [place] locations is the same.  °[facility] [address], [place] (336) 299-0000   °[facility] [address] [place] [address], [place] or [address] (336) 852-7530 °(336) 878-2260   °[facility] [address], [place] (336) 350-1642, phone; (336) 294-5005, fax Sees patients 1st and 3rd Saturday of every month.  Must not qualify for public or private insurance (i.e. Medicaid, Medicare, [place] Health Choice, Veterans' Benefits) • Household income should be no more than 200% of the poverty level •The clinic cannot treat you if you are pregnant or think you are pregnant • Sexually transmitted diseases are not treated at the clinic.  ° ° °Dental Care: °Organization         Address  Phone  Notes  °[place] [facility] [address], [place] (336) 641-6152 Accepts children up to age 21 who are enrolled in Medicaid or [place] Health Choice; pregnant women with a Medicaid card; and children who have applied for Medicaid or [place] Health Choice, but were declined, whose parents can pay a reduced fee at time of service.  °[place]  [facility] [place]  [address], [place] (336) 641-7733 Accepts children up to age 21 who are enrolled in Medicaid or [place] Health Choice; pregnant women with a Medicaid card; and children who have applied for Medicaid or [place] Health Choice, but were declined, whose parents can pay a reduced fee at time of service.  °[facility] ° [address], [place] (336) 641-4533 Patients are seen by appointment only. Walk-ins are not accepted. [facility] will see patients 18 years of age and older. °Monday - Tuesday (8am-5pm) °Most Wednesdays (8:30-5pm) °$30 per visit, cash only  °[facility] ° [address], [place] (336) [phone] Patients are seen by appointment only. Walk-ins are not accepted. [facility] will see patients 18 years of age and older. °One   Wednesday Evening (Monthly: Volunteer Based).  $30 per visit, cash only  °[facility]  (919) [phone] for adults; Children under age 4, call Graduate Pediatric Dentistry at ([phone]) [phone]. Children aged 4-14, please call (919) [phone] to request a pediatric application. ° Dental services are provided in all areas of dental care including fillings, crowns and bridges, complete and partial dentures, implants, gum treatment, root canals, and extractions. Preventive care is also provided. Treatment is provided to both adults and children. °Patients are selected via a lottery and there is often a waiting list. °  °[facility] [address], °[place] ° (336) 763-8833 [email or web address] °  °[facility] [address], [place], [place] (336)723-1848, Ext. 123 Second and Fourth Thursday of each month, opens at 6:30 AM; Clinic ends at 9 AM.  Patients are seen on a first-come first-served basis, and a limited number are seen during each clinic.  ° °[facility] ° [address], [place], [place] (336) 723-7904    Eligibility Requirements °You must have lived in [place], [place], or [place] counties for at least the last three months. °  You cannot be eligible for state or federal sponsored healthcare insurance, including Veterans Administration, Medicaid, or Medicare. °  You generally cannot be eligible for healthcare insurance through your employer.  °  How to apply: °Eligibility screenings are held every Tuesday and Wednesday afternoon from 1:00 pm until 4:00 pm. You do not need an appointment for the interview!  °[facility] [address], [place], [place] 336-631-2330   °[facility]  336-342-8273   °[facility]  336-703-3100   °[facility]  336-570-6415   ° °Behavioral Health Resources in the Community: °Intensive Outpatient Programs °Organization         Address  Phone  Notes  °[facility] [address], [place], [place] 336-878-6098   °[facility] [address], [place], [place] 336-832-9800   °[facility] [address], [place], [place] ° 336-882-2125   °[facility] [address],  °[place], [place] 1-800-853-5163 or 336-641-4981   °Substance Abuse Resources °Organization         Address  Phone  Notes  °[facility]  336-882-2125   °[facility]  336-784-9470   °[facility]  336-285-9073   °[facility]  336-845-3988   °Residential & Outpatient Substance Abuse Program  1-800-659-3381   °Psychological Services °Organization         Address  Phone  Notes  °[facility]  336- 832-9600   °[facility]  336- 378-7881   °[facility] [address], [place] 1-800-853-5163 or 336-641-4981   ° °Mobile Crisis Teams °Organization         Address  Phone  Notes  °[facility], Mobile Crisis Care Unit  1-877-626-1772   °Assertive °[facility] ° [address]  [place], [place] 336-834-9664   °Sharon DeEsch [address] ° [place] 336-554-5454   ° °Self-Help/Support Groups °Organization         Address  Phone             Notes  °[facility]  - variety of support groups  336- 373-1402 Call for more information  °Narcotics Anonymous (NA), [facility] [address], °[place]  2 meetings at this location  ° °  Residential Treatment Programs °Organization         Address  Phone  Notes  °[facility] [address],    °[place]  1-866-801-8205   °[facility] ° [address], [place], [place] 704-293-8524   °[facility] [address], [place] 336-845-3988 Admissions: 8am-3pm M-F  °[facility] [address]    °[place], [place] 336-841-1104   °[facility] [address], [place], [place] 336-379-7146   °[facility] [address]  °[place], [place] 336-285-9073   °[facility] Intensive Outpatient [address], [place], [place] 336-852-3033   °[facility] [address]  °[place], [place] 1-877-615-2722 or 336-784-9470   °[facility] [address] [place], [place] 336-227-7417 Accepts Medicaid  °[facility] [address]  °[place] 1-800-659-3381 Substance Abuse/Addiction Treatment  ° °[place] Behavioral Health Resources °Organization         Address  Phone  Notes  °[facility]  (888) 581-9988   °Julie Brannon, PhD [address] [place], [place]   (336) 349-5553 or (336) 951-0000   °[facility]   [address] °[place], [place] (336) 349-4454   °[facility] [address], [place], [place] (336) 342-8316 Insurance/Medicaid/sponsorship through [facility]  °[facility] [address]                                    [place], [place] (336) 342-8316 Therapy/tele-psych/case    °[facility] [address]  ° [place], [place] (336) 349-2233    °Dr. Arfeen  (336) 349-4544   °[facility] [place]  [employer] [place] [facility] 1) [address], [place] °2) [address], [place] °3)  [address], [place] (336) 349-3220 °(336) 342-7768 ° °(336) 342-8140   °[place] [facility] (336) 342-1394 or (336) 342-3537 (After Hours)    ° ° ° °Take the prescription as directed.  Call your regular medical doctor today to schedule a follow up appointment within the next 2 days.  Return to the Emergency Department immediately sooner if worsening.  ° °

## 2014-06-15 NOTE — ED Notes (Signed)
Pt reports weakness, disorientation and dizziness that started yesterday on awakening. Pt reports UTI symptoms x 1 month.

## 2014-06-18 LAB — URINE CULTURE: Colony Count: 85000

## 2014-06-20 ENCOUNTER — Telehealth (HOSPITAL_COMMUNITY): Payer: Self-pay

## 2014-06-20 NOTE — Telephone Encounter (Signed)
Post ED Visit - Positive Culture Follow-up  Culture report reviewed by antimicrobial stewardship pharmacist: []  Wes Dulaney, Pharm.D., BCPS []  Celedonio MiyamotoJeremy Frens, Pharm.D., BCPS [x]  Georgina PillionElizabeth Martin, Pharm.D., BCPS []  UnionvilleMinh Pham, VermontPharm.D., BCPS, AAHIVP []  Estella HuskMichelle Turner, Pharm.D., BCPS, AAHIVP []  Elder CyphersLorie Poole, [address]Pharm[address]D., BCPS  Positive Urine culture, >/= 85,000 colonies -> E Coli Treated with Cephalexin, organism sensitive to the same and no further patient follow-up is required at this time.  Elizabeth RightClark, Elizabeth Williams Dorn 06/20/2014, 5:30 AM

## 2014-09-12 ENCOUNTER — Encounter (HOSPITAL_COMMUNITY): Payer: Self-pay | Admitting: Emergency Medicine

## 2014-09-12 ENCOUNTER — Emergency Department (HOSPITAL_COMMUNITY)
Admission: EM | Admit: 2014-09-12 | Discharge: 2014-09-12 | Disposition: A | Discharge status: Home / Self Care | Payer: Self-pay | Attending: Emergency Medicine | Admitting: Emergency Medicine

## 2014-09-12 DIAGNOSIS — M6281 Muscle weakness (generalized): Secondary | ICD-10-CM | POA: Insufficient documentation

## 2014-09-12 DIAGNOSIS — Z8659 Personal history of other mental and behavioral disorders: Secondary | ICD-10-CM | POA: Insufficient documentation

## 2014-09-12 DIAGNOSIS — N390 Urinary tract infection, site not specified: Secondary | ICD-10-CM | POA: Insufficient documentation

## 2014-09-12 DIAGNOSIS — Z8639 Personal history of other endocrine, nutritional and metabolic disease: Secondary | ICD-10-CM | POA: Insufficient documentation

## 2014-09-12 DIAGNOSIS — Z79899 Other long term (current) drug therapy: Secondary | ICD-10-CM | POA: Insufficient documentation

## 2014-09-12 DIAGNOSIS — A499 Bacterial infection, unspecified: Secondary | ICD-10-CM

## 2014-09-12 DIAGNOSIS — Z87448 Personal history of other diseases of urinary system: Secondary | ICD-10-CM | POA: Insufficient documentation

## 2014-09-12 DIAGNOSIS — Z720 Tobacco use: Secondary | ICD-10-CM | POA: Insufficient documentation

## 2014-09-12 LAB — URINALYSIS, ROUTINE W REFLEX MICROSCOPIC
Bilirubin Urine: NEGATIVE
Glucose, UA: NEGATIVE mg/dL
Ketones, ur: NEGATIVE mg/dL
Nitrite: NEGATIVE
SPECIFIC GRAVITY, URINE: 1.025 (ref 1.005–1.030)
Urobilinogen, UA: 0.2 mg/dL (ref 0.0–1.0)
pH: 6 (ref 5.0–8.0)

## 2014-09-12 LAB — URINE MICROSCOPIC-ADD ON

## 2014-09-12 MED ORDER — CIPROFLOXACIN HCL 500 MG PO TABS: 500.0000 mg | ORAL_TABLET | Freq: Two times a day (BID) | ORAL | Status: DC

## 2014-09-12 NOTE — ED Notes (Signed)
Pt reports continuing UTI symptoms since infection in April. Pt states she became disoriented and weak last night. Has been to urinate mult times this am.

## 2014-09-12 NOTE — ED Provider Notes (Signed)
CSN: [id number]     Arrival date & time 09/12/14  1046 History  This chart was scribed for Geoffery Lyons, MD by Freida Busman, ED Scribe. This patient was seen in room APA03/APA03 and the patient's care was started 11:21 AM.    Chief Complaint  Patient presents with  . Recurrent UTI     Patient is a 54 y.o. female presenting with frequency and dysuria. The history is provided by the patient. No language interpreter was used.  Urinary Frequency This is a recurrent problem. The current episode started yesterday. Pertinent negatives include no chest pain, no abdominal pain, no headaches and no shortness of breath.  Dysuria Chronicity:  Recurrent Recent urinary tract infections: yes   Ineffective treatments:  None tried Associated symptoms: nausea   Associated symptoms: no abdominal pain, no fever and no vomiting      HPI Comments:  Elizabeth Williams is a 54 y.o. female who presents to the Emergency Department complaining of frequent urination and dysuria since last night.  She reports associated nausea and generalized weakness. She was diagnosed with a UTI in April 2016 and placed on antibiotics but states does not believe the entire infection cleared.  She denies fever and vomiting. No alleviating factors noted.   Past Medical History  Diagnosis Date  . Thyroid disease   . Depression   . Renal insufficiency    Past Surgical History  Procedure Laterality Date  . Gastroplasty    . Knee surgery    . Carpal tunnel release    . Tubal ligation     Family History  Problem Relation Age of Onset  . Adopted: Yes  . Diabetes Mother   . Diabetes Other    History  Substance Use Topics  . Smoking status: Current Every Day Smoker -- 1.00 packs/day    Types: Cigarettes  . Smokeless tobacco: Never Used  . Alcohol Use: 0.6 oz/week    1 Glasses of wine per week     Comment: "once a week"   OB History    No data available     Review of Systems  Constitutional: Negative for fever.   Respiratory: Negative for shortness of breath.   Cardiovascular: Negative for chest pain.  Gastrointestinal: Positive for nausea. Negative for vomiting and abdominal pain.  Genitourinary: Positive for dysuria and frequency.  Neurological: Positive for weakness (Generalized). Negative for headaches.  All other systems reviewed and are negative.     Allergies  Codeine and Nsaids  Home Medications   Prior to Admission medications   Medication Sig Start Date End Date Taking? Authorizing Provider  buPROPion (WELLBUTRIN XL) 300 MG 24 hr tablet Take 300 mg by mouth daily.    Historical Provider, MD  cephALEXin (KEFLEX) 500 MG capsule Take 1 capsule (500 mg total) by mouth 4 (four) times daily. 06/15/14   Samuel Jester, DO  ondansetron (ZOFRAN) 4 MG tablet Take 1 tablet (4 mg total) by mouth every 6 (six) hours. Patient not taking: Reported on 06/15/2014 05/22/14   Tammy Triplett, PA-C  oxyCODONE-acetaminophen (PERCOCET/ROXICET) 5-325 MG per tablet Take 1 tablet by mouth every 4 (four) hours as needed. Patient not taking: Reported on 06/15/2014 05/22/14   Tammy Triplett, PA-C   BP 146/78 mmHg  Pulse 72  Temp(Src) 97.8 F (36.6 C) (Oral)  Resp 20  Ht  (1.753 m)  Wt 200 lb (90.719 kg)  BMI 29.52 kg/m2  SpO2 100% Physical Exam  Constitutional: She is oriented to person, place, and time.  She appears well-developed and well-nourished. No distress.  HENT:  Head: Normocephalic and atraumatic.  Eyes: EOM are normal.  Neck: Normal range of motion.  Cardiovascular: Normal rate, regular rhythm and normal heart sounds.   Pulmonary/Chest: Effort normal and breath sounds normal.  Abdominal: Soft. She exhibits no distension. There is no tenderness.  No CVA tenderness   Musculoskeletal: Normal range of motion.  Neurological: She is alert and oriented to person, place, and time.  Skin: Skin is warm and dry.  Psychiatric: She has a normal mood and affect. Judgment normal.  Nursing note and  vitals reviewed.   ED Course  Procedures   DIAGNOSTIC STUDIES:  Oxygen Saturation is 100% on RA, normal by my interpretation.    COORDINATION OF CARE:  11:26 AM Will order urinalysis. Discussed treatment plan with pt at bedside and pt agreed to plan.  Labs Review Labs Reviewed - No data to display  Imaging Review No results found.   EKG Interpretation None      MDM   Final diagnoses:  None    UA reveals a UTI. Will treat with Cipro and when necessary return.  I personally performed the services described in this documentation, which was scribed in my presence. The recorded information has been reviewed and is accurate.      Geoffery Lyonsouglas Keshav Winegar, MD 09/12/14 [phone]

## 2014-09-12 NOTE — Discharge Instructions (Signed)
Cipro as prescribed.  Return to the emergency department if he develops severe pain, high fever, vomiting, or other new and concerning symptoms.   Urinary Tract Infection Urinary tract infections (UTIs) can develop anywhere along your urinary tract. Your urinary tract is your body's drainage system for removing wastes and extra water. Your urinary tract includes two kidneys, two ureters, a bladder, and a urethra. Your kidneys are a pair of bean-shaped organs. Each kidney is about the size of your fist. They are located below your ribs, one on each side of your spine. CAUSES Infections are caused by microbes, which are microscopic organisms, including fungi, viruses, and bacteria. These organisms are so small that they can only be seen through a microscope. Bacteria are the microbes that most commonly cause UTIs. SYMPTOMS  Symptoms of UTIs may vary by age and gender of the patient and by the location of the infection. Symptoms in young women typically include a frequent and intense urge to urinate and a painful, burning feeling in the bladder or urethra during urination. Older women and men are more likely to be tired, shaky, and weak and have muscle aches and abdominal pain. A fever may mean the infection is in your kidneys. Other symptoms of a kidney infection include pain in your back or sides below the ribs, nausea, and vomiting. DIAGNOSIS To diagnose a UTI, your caregiver will ask you about your symptoms. Your caregiver also will ask to provide a urine sample. The urine sample will be tested for bacteria and white blood cells. White blood cells are made by your body to help fight infection. TREATMENT  Typically, UTIs can be treated with medication. Because most UTIs are caused by a bacterial infection, they usually can be treated with the use of antibiotics. The choice of antibiotic and length of treatment depend on your symptoms and the type of bacteria causing your infection. HOME CARE  INSTRUCTIONS  If you were prescribed antibiotics, take them exactly as your caregiver instructs you. Finish the medication even if you feel better after you have only taken some of the medication.  Drink enough water and fluids to keep your urine clear or pale yellow.  Avoid caffeine, tea, and carbonated beverages. They tend to irritate your bladder.  Empty your bladder often. Avoid holding urine for long periods of time.  Empty your bladder before and after sexual intercourse.  After a bowel movement, women should cleanse from front to back. Use each tissue only once. SEEK MEDICAL CARE IF:   You have back pain.  You develop a fever.  Your symptoms do not begin to resolve within 3 days. SEEK IMMEDIATE MEDICAL CARE IF:   You have severe back pain or lower abdominal pain.  You develop chills.  You have nausea or vomiting.  You have continued burning or discomfort with urination. MAKE SURE YOU:   Understand these instructions.  Will watch your condition.  Will get help right away if you are not doing well or get worse. Document Released: 12/05/2004 Document Revised: 08/27/2011 Document Reviewed: 04/05/2011 [facility] Patient Information 2015 [employer], [place]. This information is not intended to replace advice given to you by your health care provider. Make sure you discuss any questions you have with your health care provider.

## 2014-11-27 IMAGING — CR DG KNEE COMPLETE 4+V*L*
1 series · 4 of 4 positions shown · non-contrast
Comparison: None

CLINICAL DATA: Dog ran into leg last night, medial pain, prior
surgery

EXAM:
LEFT KNEE - COMPLETE 4+ VIEW

[Series 1: t knee ap left · 0.14mm/px · 4 of 4 slices shown]
[im 1/4]
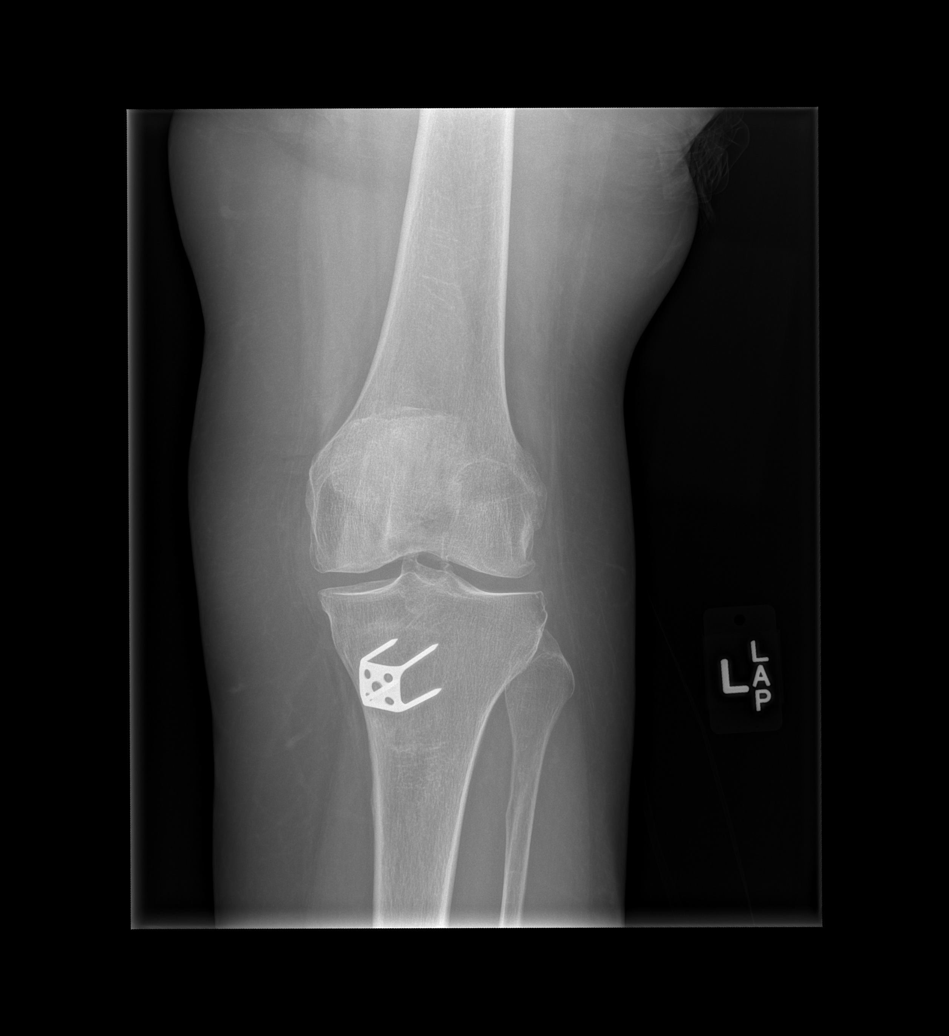
[im 2/4]
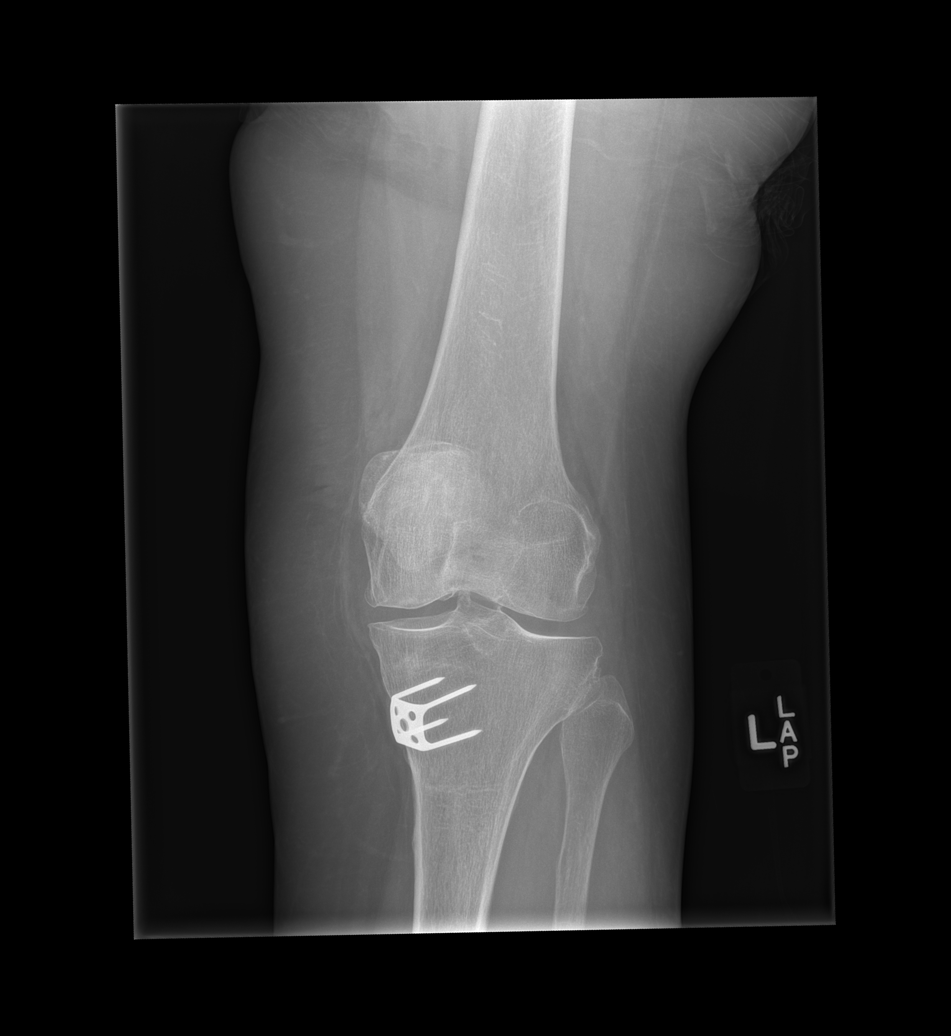
[im 3/4]
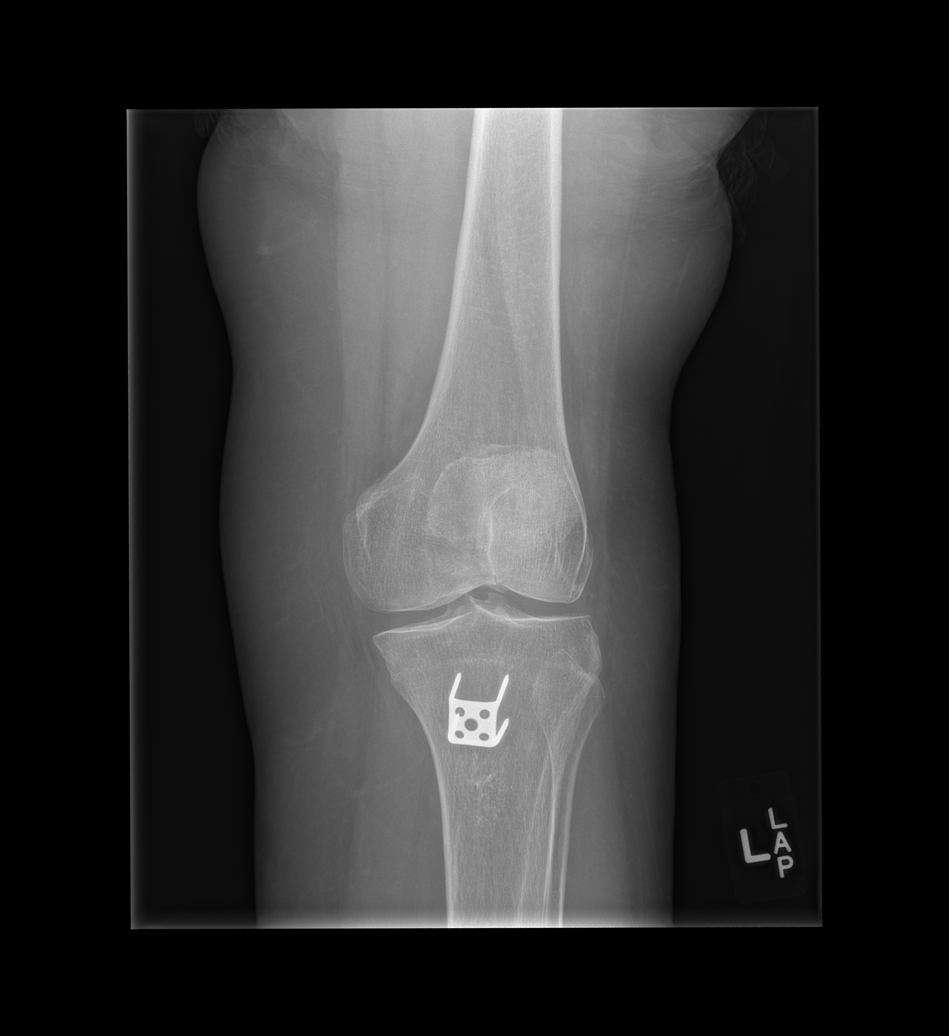
[im 4/4]
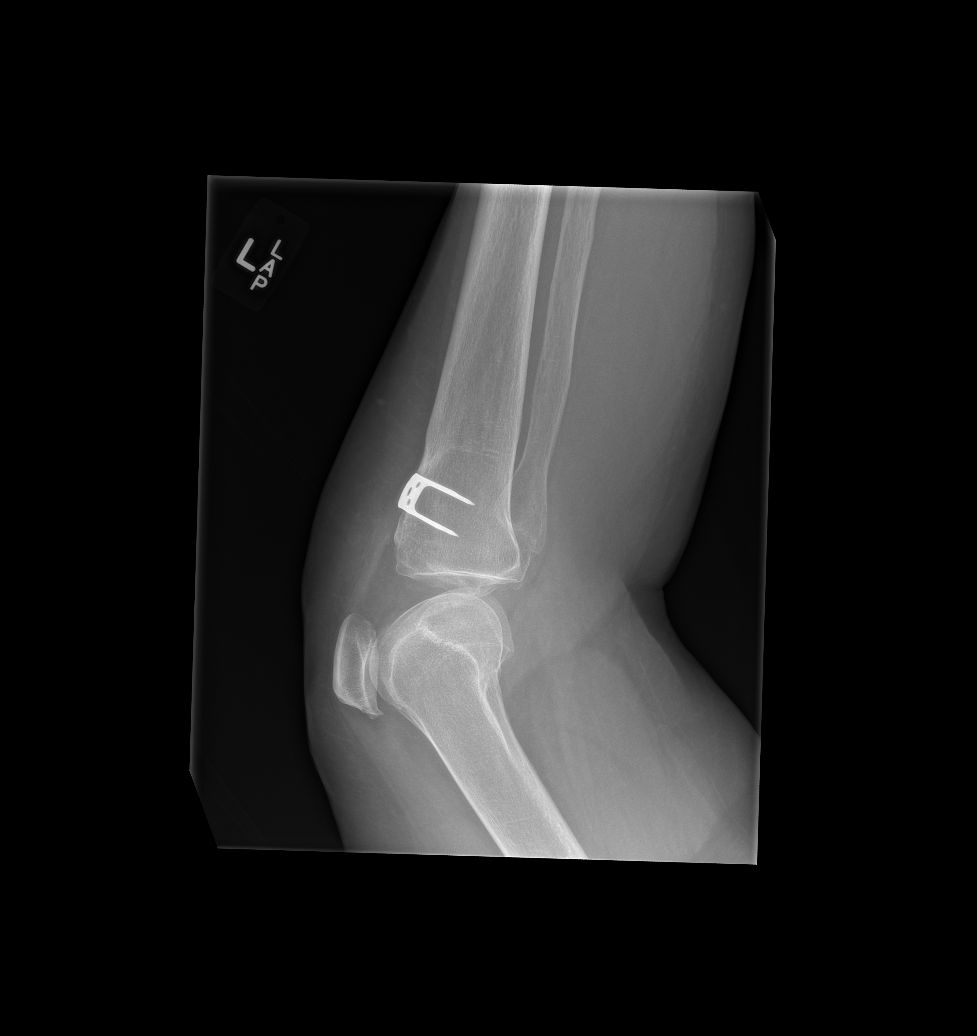

[4 of 4 positions shown; findings below may reference images not displayed]

FINDINGS: Large staple anteriorly at the proximal tibia at the tibial
tubercle.

Diffuse osseous demineralization.

Mild patellofemoral joint space narrowing with minimal spurring.

No acute fracture, dislocation or bone destruction.

No knee joint effusion.

Medial lateral compartments fairly well preserved.
IMPRESSION: Osseous demineralization with postsurgical changes at the tibial
tubercle and mild patellofemoral degenerative changes.

No acute abnormalities.

## 2016-04-04 IMAGING — DX DG KNEE COMPLETE 4+V*L*
4 series · 4 of 4 positions shown · non-contrast
Comparison: 04/13/2014.

CLINICAL DATA: 54-year-old male with history of trauma from a fall
in March 2014, injury to the left knee at that time complaining of
recurrent pain for the past 5 days.

EXAM:
LEFT KNEE - COMPLETE 4+ VIEW

[knee ap]
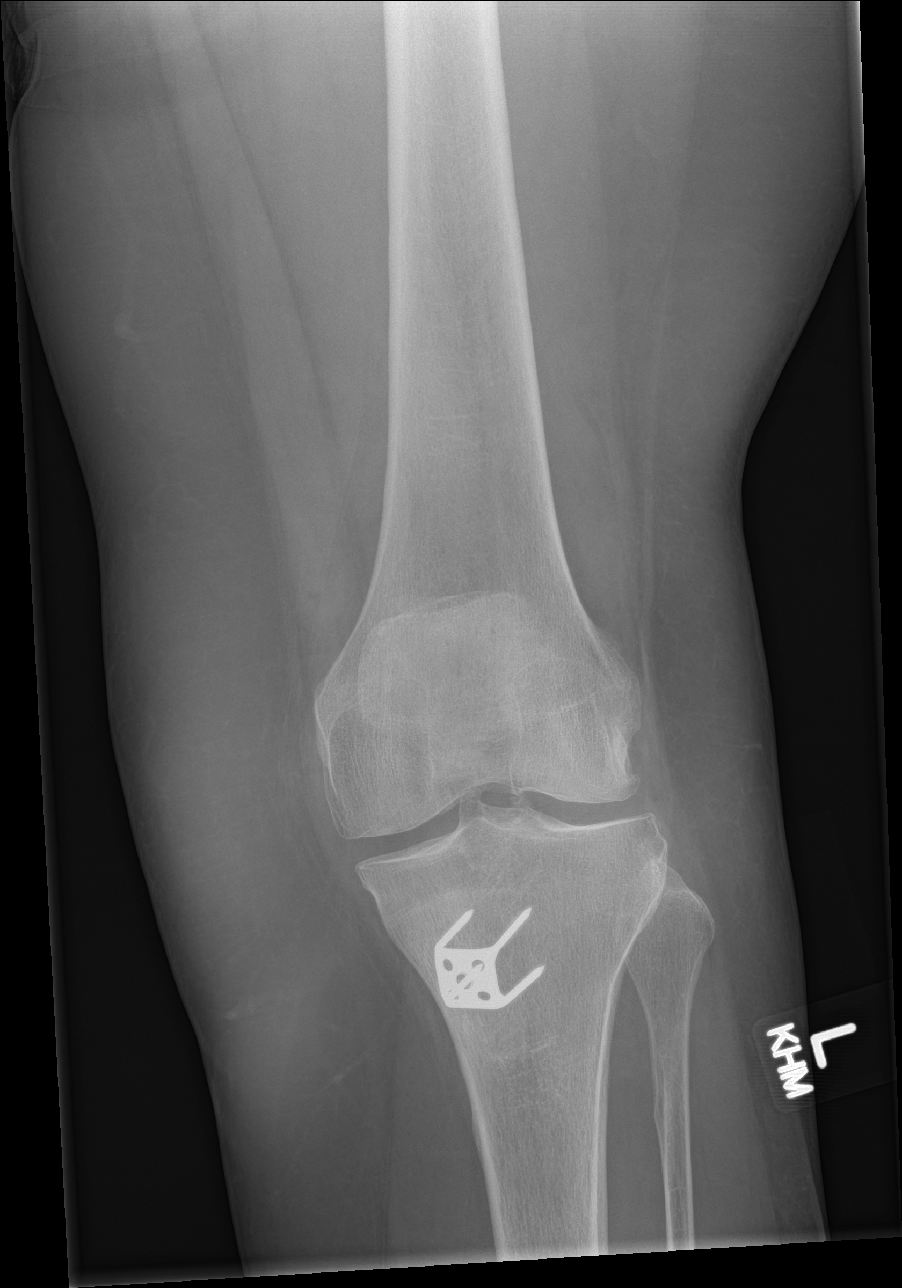

[knee obl (1 of 2)]
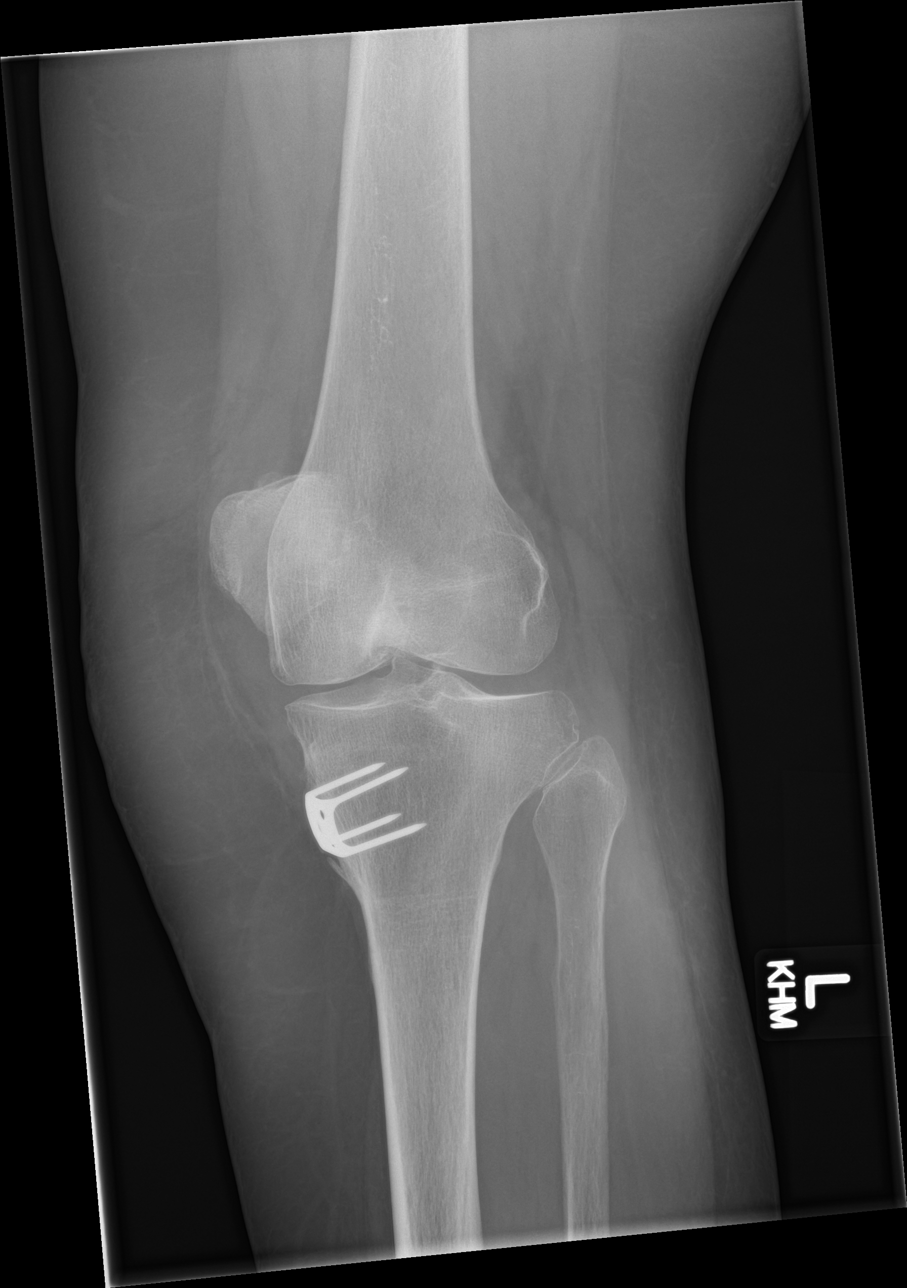

[knee obl (2 of 2)]
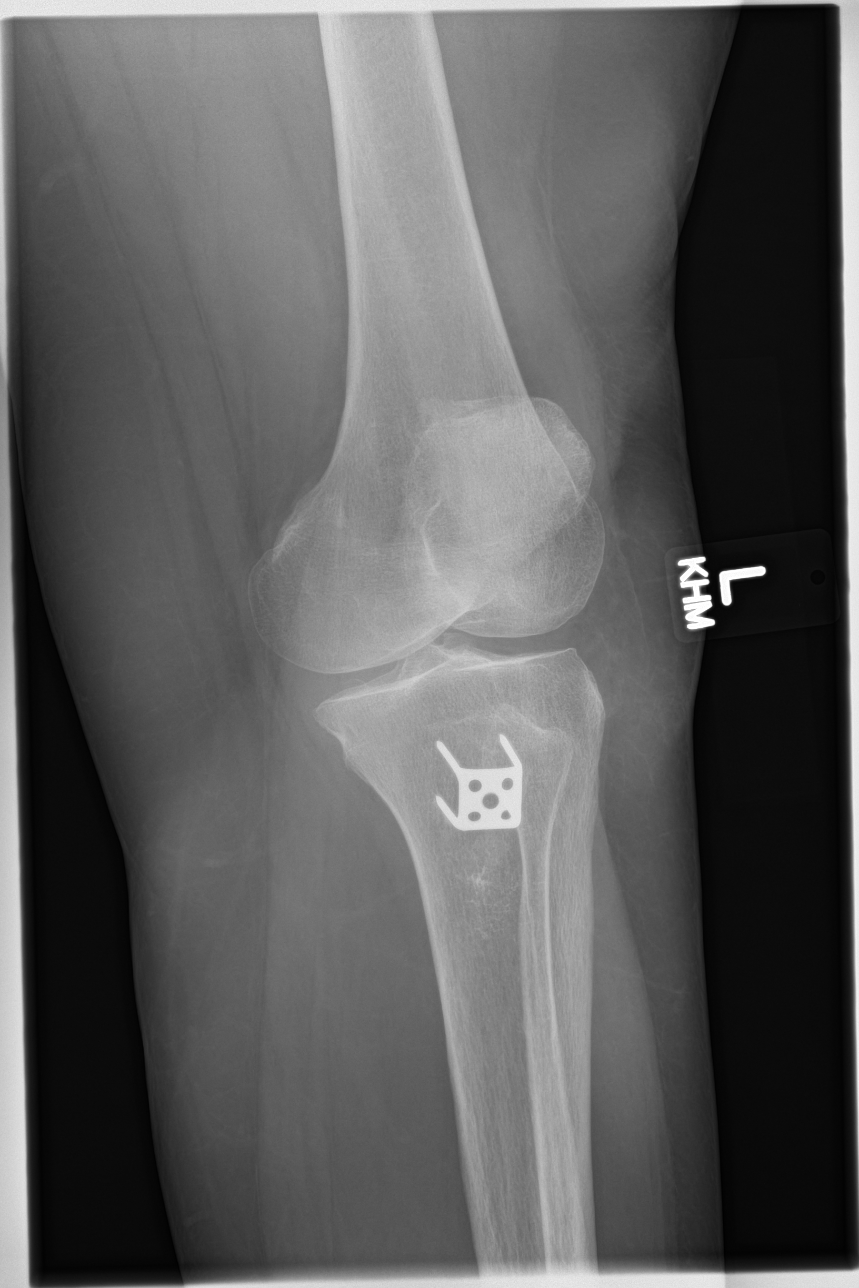

[knee lat]
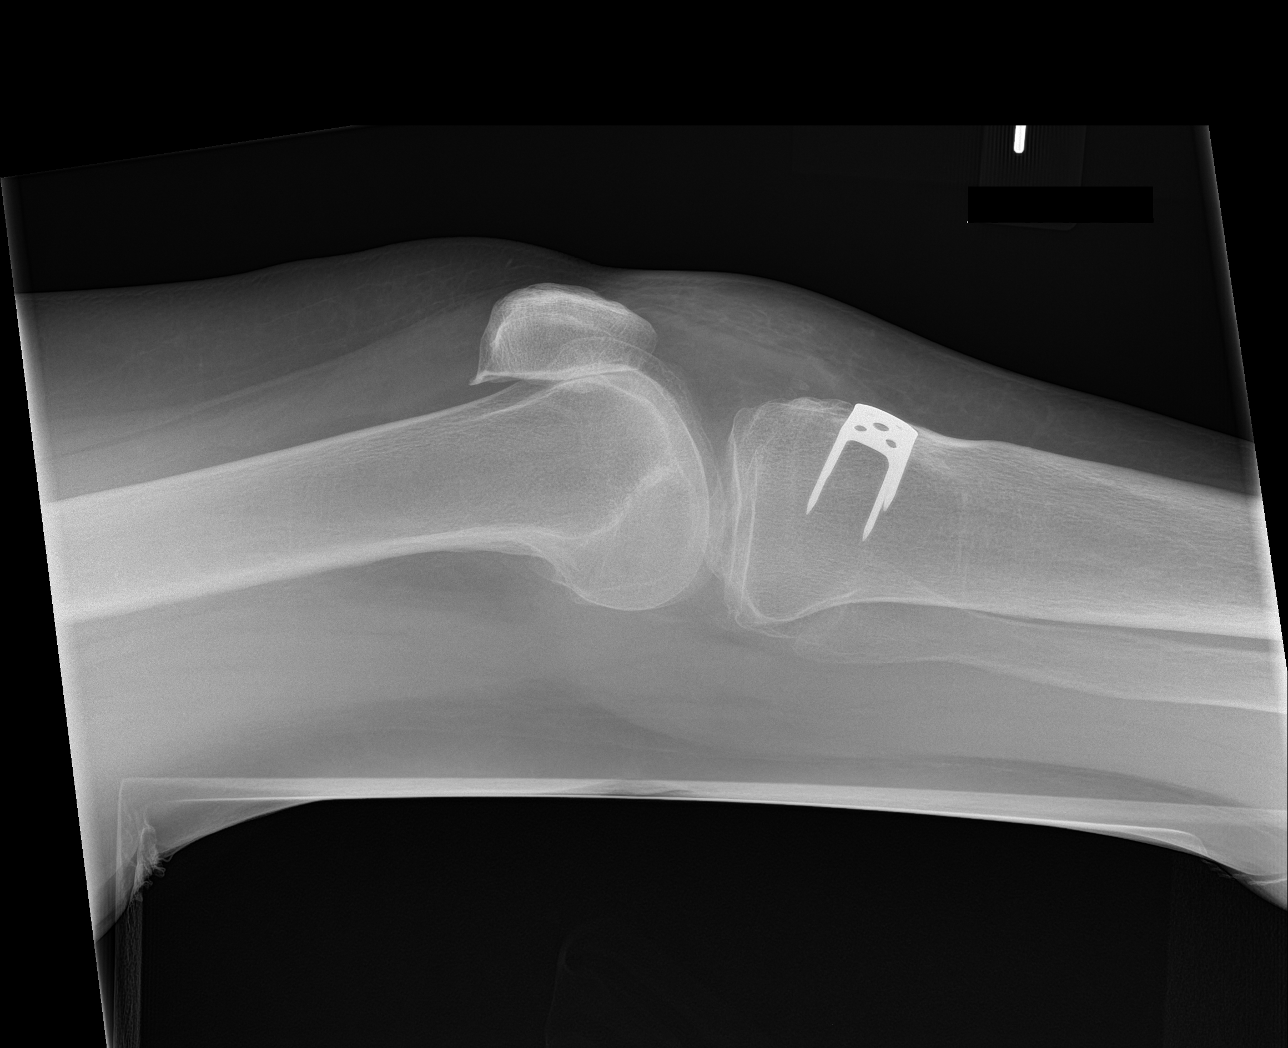

[4 of 4 positions shown; findings below may reference images not displayed]

FINDINGS: Four views of the left knee demonstrate no acute displaced fracture,
subluxation or dislocation. Mild joint space narrowing, subchondral
sclerosis and osteophyte formation is noted in a tricompartmental
distribution, most severe in the patellofemoral compartment.
Fixation staple anteriorly in the proximal tibia is unchanged.
IMPRESSION: 1. No acute radiographic abnormality of the left knee.
2. Mild tricompartmental osteoarthritis, most pronounced in the
patellofemoral compartment.

## 2017-03-25 ENCOUNTER — Ambulatory Visit: Payer: Self-pay | Admitting: Family Medicine

## 2017-04-10 ENCOUNTER — Ambulatory Visit: Payer: Self-pay | Admitting: Nurse Practitioner

## 2017-06-06 ENCOUNTER — Other Ambulatory Visit: Payer: Self-pay | Admitting: Student

## 2017-06-06 DIAGNOSIS — Z1239 Encounter for other screening for malignant neoplasm of breast: Secondary | ICD-10-CM

## 2017-06-18 ENCOUNTER — Encounter: Payer: Self-pay | Admitting: Dietician

## 2017-06-18 ENCOUNTER — Encounter: Payer: BLUE CROSS/BLUE SHIELD | Attending: Student | Admitting: Dietician

## 2017-06-18 VITALS — Ht 68.0 in | Wt 229.7 lb

## 2017-06-18 DIAGNOSIS — E669 Obesity, unspecified: Secondary | ICD-10-CM

## 2017-06-18 DIAGNOSIS — Z6834 Body mass index (BMI) 34.0-34.9, adult: Secondary | ICD-10-CM

## 2017-06-18 DIAGNOSIS — R7303 Prediabetes: Secondary | ICD-10-CM | POA: Diagnosis not present

## 2017-06-18 NOTE — Progress Notes (Signed)
Medical Nutrition Therapy: Visit start time: 1545  end time: 1630 Assessment:  Diagnosis: prediabetes, obesity Past medical history: GERD, Hypothyroidism, HLD Psychosocial issues/ stress concerns: dx of anxiety and depression Preferred learning method:  . Hands-on  Current weight: 229.7lb  Height: 5\' 8"  Medications, supplements: Synthroid, Vitamin B-complex  Progress and evaluation: Patient with dx of prediabetes and HLD who is seeking nutritional guidance to help manage health conditions on low income. She describes a high degree of food insecurity, where she has had to go long periods of time without eating, has gone into (suspected) ketosis, or has had to resort to canned food from food pantries for up to 3 months. Her daily calorie goal has been 500 calories to avoid "going back into ketosis" which she tracks in [employer]. Her home has limited electricity and refrigeration, no stove, no running water most of the time or a drainage system. She does have a microwave, a new hot plate to boil water, and a partially- useable convection oven. Most of the food she eats is microwaved. She has had more income lately and has ordered a pre-cooked meal prep service which she can keep in the fridge and reheat, and has also been eating out more. She prefers a plant-based style of eating but does eat meat occasionally. Other sources of protein include shakes with added protein powder or Boost pre-mixed shakes. Does not drink milk. Used to eat a macrobiotic diet but stopped d/t monetary constraints. She has been opting for more gluten free foods d/t noticing flare-ups with arthritis when she consumes gluten. She plans to plant vegetables this year in order to supply some of her own food.  Physical activity: None. Plans to start biking to work & doing karate again  Dietary Intake:  Usual eating pattern includes 1-2 meals and 0-2 snacks per day. Dining out frequency: "most" meals per week d/t limited ability to  cook or wash dishes at home  Breakfast: smoothie Snack: none Lunch: veggie burgers, veggie chicken patties, whole wheat sandwich thins, salad Snack: none Supper: may or may not have Snack: yogurt, apples, grapes Beverages: water, sweet tea  Nutrition Care Education: Topics covered: importance of protein and meeting daily nutritional needs, added sugars, grocery shopping on a budget, plant-based protein sources, breakfast ideas, dietary interventions for arthritis management Basic nutrition: basic food groups, appropriate nutrient balance, appropriate meal and snack schedule, general nutrition guidelines    Weight control: benefits of weight control, behavioral changes for weight loss Advanced nutrition: cooking techniques, dining out Hyperlipidemia: healthy and unhealthy fats, role of fiber Other lifestyle changes:  benefits of making changes, readiness for change, identifying habits that need to change  Nutritional Diagnosis:  NB-3.2 Limited access to food or water As related to  food insecurity, housing situation.  As evidenced by patient report of current residence to have limited electricity, water, refrigeration, and cooking options; patient report of relying on food pantries or being unable to afford food in recent months.  Intervention: Patient will start to make meals at home more often now that she has the ability to boil water and has limited use of a convection oven. She will increase he daily calorie intake as she is able, focusing on adding sources of protein, and looking for inexpensive food items at the store as mentioned.  Education Materials given:  . Preventing Diabetes handout . Healthier fast food options handout . Goals/ instructions  Learner/ who was taught:  . Patient   Level of understanding: Marland Kitchen. Verbalizes/ demonstrates  competency  Demonstrated degree of understanding via:   Teach back Learning barriers: . None  Willingness to learn/ readiness for  change: . Eager, change in progress  Monitoring and Evaluation:  Dietary intake, exercise, HgbA1c, and body weight      follow up: prn

## 2017-06-18 NOTE — Patient Instructions (Addendum)
   1200 calories= daily calorie goal to work up to at [employer]!  Add proteins to your smoothies: yogurt, tofu, protein powder, chia or hemp seeds  Minimize sources of added sugars, such as sweet tea and include more foods with natural sugars like honey or fruit  Focus on sources of protein and vegetables at meal times, with smaller portions of starches/ carbohydrates and limited portions of added fats  Buying items in bulk, such as rice or beans, can help you save money too!  Frozen section: vegetables, veggie/grain combo options, veggie pastas  90 second 'quick cook' bag grains are also cheap and convenient

## 2017-06-20 ENCOUNTER — Encounter: Payer: Self-pay | Admitting: Dietician

## 2018-09-14 ENCOUNTER — Other Ambulatory Visit: Payer: Self-pay

## 2019-05-25 ENCOUNTER — Other Ambulatory Visit: Payer: Self-pay | Admitting: Gastroenterology

## 2019-05-25 DIAGNOSIS — K219 Gastro-esophageal reflux disease without esophagitis: Secondary | ICD-10-CM

## 2019-05-25 DIAGNOSIS — R112 Nausea with vomiting, unspecified: Secondary | ICD-10-CM

## 2019-05-25 DIAGNOSIS — R1310 Dysphagia, unspecified: Secondary | ICD-10-CM

## 2019-05-25 DIAGNOSIS — K449 Diaphragmatic hernia without obstruction or gangrene: Secondary | ICD-10-CM

## 2019-05-25 DIAGNOSIS — R1013 Epigastric pain: Secondary | ICD-10-CM

## 2019-05-25 DIAGNOSIS — R1319 Other dysphagia: Secondary | ICD-10-CM

## 2019-05-26 ENCOUNTER — Ambulatory Visit
Admission: RE | Admit: 2019-05-26 | Discharge: 2019-05-26 | Disposition: A | Discharge status: Home / Self Care | Payer: Medicaid Other | Source: Ambulatory Visit | Attending: Gastroenterology | Admitting: Gastroenterology

## 2019-05-26 ENCOUNTER — Other Ambulatory Visit: Payer: Self-pay

## 2019-05-26 DIAGNOSIS — R1319 Other dysphagia: Secondary | ICD-10-CM

## 2019-05-26 DIAGNOSIS — R1310 Dysphagia, unspecified: Secondary | ICD-10-CM

## 2019-05-26 DIAGNOSIS — K219 Gastro-esophageal reflux disease without esophagitis: Secondary | ICD-10-CM

## 2019-05-26 DIAGNOSIS — R112 Nausea with vomiting, unspecified: Secondary | ICD-10-CM | POA: Insufficient documentation

## 2019-05-26 DIAGNOSIS — K449 Diaphragmatic hernia without obstruction or gangrene: Secondary | ICD-10-CM | POA: Insufficient documentation

## 2019-05-26 DIAGNOSIS — R1013 Epigastric pain: Secondary | ICD-10-CM | POA: Insufficient documentation

## 2020-06-11 ENCOUNTER — Emergency Department
Admission: EM | Admit: 2020-06-11 | Discharge: 2020-06-11 | Disposition: A | Discharge status: Home / Self Care | Payer: Self-pay | Attending: Emergency Medicine | Admitting: Emergency Medicine

## 2020-06-11 ENCOUNTER — Other Ambulatory Visit: Payer: Self-pay

## 2020-06-11 ENCOUNTER — Emergency Department: Payer: Self-pay

## 2020-06-11 ENCOUNTER — Encounter: Payer: Self-pay | Admitting: Emergency Medicine

## 2020-06-11 DIAGNOSIS — Z79899 Other long term (current) drug therapy: Secondary | ICD-10-CM | POA: Insufficient documentation

## 2020-06-11 DIAGNOSIS — S9032XA Contusion of left foot, initial encounter: Secondary | ICD-10-CM | POA: Insufficient documentation

## 2020-06-11 DIAGNOSIS — F17210 Nicotine dependence, cigarettes, uncomplicated: Secondary | ICD-10-CM | POA: Insufficient documentation

## 2020-06-11 DIAGNOSIS — W01198A Fall on same level from slipping, tripping and stumbling with subsequent striking against other object, initial encounter: Secondary | ICD-10-CM | POA: Insufficient documentation

## 2020-06-11 HISTORY — DX: Attention-deficit hyperactivity disorder, unspecified type: F90.9

## 2020-06-11 NOTE — ED Triage Notes (Signed)
Pt via POV from home. Pt c/o L foot pain. Pt states that her flooring was getting redone and her R foot fell through the floor yesterday. Her L foot and toes bent backwards and hit the cabinet. Denies blood thinners. Denies head injury. Pt states that her L toes are in pain. Pt is A&Ox4 and NAD.

## 2020-06-11 NOTE — ED Provider Notes (Signed)
[facility] Emergency Department Provider Note ____________________________________________  Time seen: 1706  I have reviewed the triage vital signs and the nursing notes.  HISTORY  Chief Complaint  Fall   HPI Elizabeth Williams is a 60 y.o. female presents her self to the ED for evaluation of acute  pain to the left foot.  Patient describes falling yesterday through a ride without floorboard.  As her right foot went through the floor, her left toes and top of her foot slammed against a cabinet.  Since that time she has had exquisite tenderness across the dorsal aspect of her toes and pain with ambulation.  She denies any deformity or dislocation.  She denies any head injury or loss of consciousness.  She presents today for evaluation of her injuries.  Past Medical History:  Diagnosis Date  . ADHD   . Depression   . Renal insufficiency   . Thyroid disease     There are no problems to display for this patient.   Past Surgical History:  Procedure Laterality Date  . CARPAL TUNNEL RELEASE    . GASTROPLASTY    . KNEE SURGERY    . TUBAL LIGATION      Prior to Admission medications   Medication Sig Start Date End Date Taking? Authorizing Provider  b complex vitamins capsule Take 1 capsule by mouth daily.    [provider]  buPROPion (WELLBUTRIN XL) 300 MG 24 hr tablet Take 300 mg by mouth daily.    [provider]  levothyroxine (SYNTHROID, LEVOTHROID) 75 MCG tablet Take 37.5 mcg by mouth daily before breakfast.    [provider]    Allergies Codeine and Nsaids  Family History  Adopted: Yes  Problem Relation Age of Onset  . Diabetes Mother   . Diabetes Other     Social History Social History   Tobacco Use  . Smoking status: Current Every Day Smoker    Packs/day: 1.00    Types: Cigarettes  . Smokeless tobacco: Never Used  Substance Use Topics  . Alcohol use: Yes    Alcohol/week: 1.0 standard drink    Types: 1  Glasses of wine per week    Comment: "once a week"  . Drug use: No    Review of Systems  Constitutional: Negative for fever. Cardiovascular: Negative for chest pain. Respiratory: Negative for shortness of breath. Gastrointestinal: Negative for abdominal pain, vomiting and diarrhea. Genitourinary: Negative for dysuria. Musculoskeletal: Negative for back pain.  Left foot pain as above. Skin: Negative for rash. Neurological: Negative for headaches, focal weakness or numbness. ____________________________________________  PHYSICAL EXAM:  VITAL SIGNS: ED Triage Vitals  Enc Vitals Group     BP 06/11/20 1627 (!) 159/93     Pulse Rate 06/11/20 1627 (!) 101     Resp 06/11/20 1627 20     Temp 06/11/20 1627 98.4 F (36.9 C)     Temp Source 06/11/20 1627 Oral     SpO2 06/11/20 1627 100 %     Weight 06/11/20 1628 225 lb (102.1 kg)     Height 06/11/20 1628 5\' 9"  (1.753 m)     Head Circumference --      Peak Flow --      Pain Score 06/11/20 1627 6     Pain Loc --      Pain Edu? --      Excl. in GC? --     Constitutional: Alert and oriented. Well appearing and in no distress. Head: Normocephalic and  atraumatic. Eyes: Conjunctivae are normal. Normal extraocular movements Cardiovascular: Normal rate, regular rhythm. Normal distal pulses. Respiratory: Normal respiratory effort. No wheezes/rales/rhonchi. Musculoskeletal: Left foot without obvious deformity, effusion or dislocation.  Patient tender to palpation across her dorsal first second and third toes primarily.  Normal ankle exam is noted.  Patient is without any palpable tenderness to the talar dome.  No calf or Achilles tenderness noted distally.  Nontender with normal range of motion in all extremities.  Neurologic:  Normal gait without ataxia. Normal speech and language. No gross focal neurologic deficits are appreciated. Skin:  Skin is warm, dry and intact. No rash noted.  No ecchymosis or edema is appreciated Psychiatric: Mood  and affect are normal. Patient exhibits appropriate insight and judgment. ___________________________________________   RADIOLOGY  DG Left Foot IMPRESSION: Probable acute cortical avulsion injury of the dorsal anterior Talus.  I, Lissa Hoard, personally viewed and evaluated these images (plain radiographs) as part of my medical decision making, as well as reviewing the written report by the radiologist. ____________________________________________  PROCEDURES  Postop shoe  Procedures ____________________________________________  INITIAL IMPRESSION / ASSESSMENT AND PLAN / ED COURSE  Patient ED evaluation of mechanical injury to the left foot and toes.  She presents after a fall and a contusion to the foot.  No radiologic evidence of fracture or dislocation.  Patient is nontender to palpation over the talar dome despite concern for possible avulsion fracture there.  She is placed in a postop shoe to ambulate as needed.  She will use a pair crutches she has at home from crutch assisted gait.  She is referred to podiatry for ongoing symptom management.  Return precautions have been discussed.   Elizabeth Williams was evaluated in Emergency Department on 06/11/2020 for the symptoms described in the history of present illness. She was evaluated in the context of the global COVID-19 pandemic, which necessitated consideration that the patient might be at risk for infection with the SARS-CoV-2 virus that causes COVID-19. Institutional protocols and algorithms that pertain to the evaluation of patients at risk for COVID-19 are in a state of rapid change based on information released by regulatory bodies including the CDC and federal and state organizations. These policies and algorithms were followed during the patient's care in the ED. ____________________________________________  FINAL CLINICAL IMPRESSION(S) / ED DIAGNOSES  Final diagnoses:  Contusion of left foot, initial encounter       Lissa Hoard, PA-C 06/11/20 1905    Minna Antis, MD 06/11/20 [phone]

## 2020-06-11 NOTE — ED Notes (Signed)
Patient is alert and oriented x4, in no acute distress, with left foot injury. Will continue to monitor and assess.

## 2020-06-11 NOTE — Discharge Instructions (Signed)
Your exam and x-ray are consistent with a contusion to the foot without any evidence of fracture or dislocation.  Wear the postop shoe as needed for comfort.  Consider over-the-counter capsaicin cream or lidocaine ointment for pain relief.  Follow-up with podiatry for ongoing symptoms.

## 2020-10-24 ENCOUNTER — Ambulatory Visit: Payer: Medicaid Other

## 2022-02-08 DIAGNOSIS — Z419 Encounter for procedure for purposes other than remedying health state, unspecified: Secondary | ICD-10-CM | POA: Diagnosis not present

## 2022-03-11 DIAGNOSIS — Z419 Encounter for procedure for purposes other than remedying health state, unspecified: Secondary | ICD-10-CM | POA: Diagnosis not present

## 2022-04-11 DIAGNOSIS — Z419 Encounter for procedure for purposes other than remedying health state, unspecified: Secondary | ICD-10-CM | POA: Diagnosis not present

## 2022-07-15 ENCOUNTER — Telehealth: Payer: Self-pay

## 2022-07-15 NOTE — Telephone Encounter (Signed)
Sending mychart msg. AS, CMA
